# Patient Record
Sex: Male | Born: 1970 | Race: Black or African American | Smoking: Never smoker
Health system: Northeastern US, Academic
[De-identification: ages and names within clinical notes are randomized; demographics above are authoritative.]

---

## 2003-05-05 ENCOUNTER — Encounter: Payer: Self-pay | Admitting: Cardiology

## 2003-05-06 ENCOUNTER — Other Ambulatory Visit: Payer: Self-pay | Admitting: Cardiology

## 2003-05-06 ENCOUNTER — Encounter: Payer: Self-pay | Admitting: Cardiology

## 2015-10-03 ENCOUNTER — Encounter (HOSPITAL_COMMUNITY): Payer: Self-pay | Admitting: *Deleted

## 2015-10-03 ENCOUNTER — Emergency Department (HOSPITAL_COMMUNITY): Payer: Medicaid Other

## 2015-10-03 ENCOUNTER — Emergency Department (HOSPITAL_COMMUNITY)
Admission: EM | Admit: 2015-10-03 | Discharge: 2015-10-04 | Disposition: A | Discharge status: Home / Self Care | Payer: Medicaid Other | Attending: Emergency Medicine | Admitting: Emergency Medicine

## 2015-10-03 DIAGNOSIS — R1011 Right upper quadrant pain: Secondary | ICD-10-CM | POA: Diagnosis present

## 2015-10-03 DIAGNOSIS — M546 Pain in thoracic spine: Secondary | ICD-10-CM | POA: Insufficient documentation

## 2015-10-03 DIAGNOSIS — Z720 Tobacco use: Secondary | ICD-10-CM | POA: Diagnosis not present

## 2015-10-03 DIAGNOSIS — R197 Diarrhea, unspecified: Secondary | ICD-10-CM | POA: Insufficient documentation

## 2015-10-03 LAB — LIPASE, BLOOD: Lipase: 33 U/L (ref 22–51)

## 2015-10-03 LAB — URINALYSIS, ROUTINE W REFLEX MICROSCOPIC
Bilirubin Urine: NEGATIVE
GLUCOSE, UA: NEGATIVE mg/dL
Hgb urine dipstick: NEGATIVE
Ketones, ur: 15 mg/dL — AB
LEUKOCYTES UA: NEGATIVE
Nitrite: NEGATIVE
PH: 5.5 (ref 5.0–8.0)
PROTEIN: NEGATIVE mg/dL
Specific Gravity, Urine: 1.031 — ABNORMAL HIGH (ref 1.005–1.030)
Urobilinogen, UA: 0.2 mg/dL (ref 0.0–1.0)

## 2015-10-03 LAB — CBC
HCT: 48.1 % (ref 39.0–52.0)
Hemoglobin: 15.2 g/dL (ref 13.0–17.0)
MCH: 24.5 pg — AB (ref 26.0–34.0)
MCHC: 31.6 g/dL (ref 30.0–36.0)
MCV: 77.6 fL — AB (ref 78.0–100.0)
PLATELETS: 224 10*3/uL (ref 150–400)
RBC: 6.2 MIL/uL — ABNORMAL HIGH (ref 4.22–5.81)
RDW: 13.2 % (ref 11.5–15.5)
WBC: 8.5 10*3/uL (ref 4.0–10.5)

## 2015-10-03 LAB — BASIC METABOLIC PANEL
Anion gap: 7 (ref 5–15)
BUN: 10 mg/dL (ref 6–20)
CHLORIDE: 103 mmol/L (ref 101–111)
CO2: 31 mmol/L (ref 22–32)
CREATININE: 1.23 mg/dL (ref 0.61–1.24)
Calcium: 9.6 mg/dL (ref 8.9–10.3)
GFR calc Af Amer: >60 mL/min (ref >60)
GFR calc non Af Amer: >60 mL/min (ref >60)
GLUCOSE: 116 mg/dL — AB (ref 65–99)
Potassium: 4.8 mmol/L (ref 3.5–5.1)
Sodium: 141 mmol/L (ref 135–145)

## 2015-10-03 LAB — I-STAT TROPONIN, ED: Troponin i, poc: 0 ng/mL (ref 0.00–0.08)

## 2015-10-03 MED ORDER — SODIUM CHLORIDE 0.9 % IV BOLUS (SEPSIS)
1000.0000 mL | Freq: Once | INTRAVENOUS | Status: AC
  Administered 2015-10-03: 1000 mL via INTRAVENOUS

## 2015-10-03 MED ORDER — IOHEXOL 300 MG/ML  SOLN
100.0000 mL | Freq: Once | INTRAMUSCULAR | Status: AC | PRN
Start: 2015-10-03 — End: 2015-10-03
  Administered 2015-10-03: 100 mL via INTRAVENOUS

## 2015-10-03 MED ORDER — MORPHINE SULFATE (PF) 4 MG/ML IV SOLN
6.0000 mg | Freq: Once | INTRAVENOUS | Status: AC
  Administered 2015-10-03: 6 mg via INTRAVENOUS
  Filled 2015-10-03: qty 2

## 2015-10-03 NOTE — ED Notes (Signed)
Pt in stating that yesterday he had an episode of abd pain with diarrhea, today started with right upper back and shoulder pain, pain is worse with movement and radiates down his side, appears uncomfortable

## 2015-10-03 NOTE — ED Provider Notes (Signed)
CSN: [id number]     Arrival date & time 10/03/15  1739 History   First MD Initiated Contact with Patient 10/03/15 2016     Chief Complaint  Patient presents with  . Back Pain    Patient is a 44 y.o. male presenting with general illness. The history is provided by the patient, the spouse and medical records.  Illness Location:  R flank area and upper abd Quality:  Severe sudden pain Severity:  Severe Onset quality:  Sudden Duration:  1 day Timing:  Constant Progression:  Worsening Chronicity:  New Context:  Pt ate suspicious food yesterday, had left sided abd cramping yesterday and then episodes of nonbloody diarrhea today, followed by acute right flank/abd pain as above Worsened by:  Light touch, position changes  Associated symptoms: abdominal pain and diarrhea   Associated symptoms: no chest pain, no fever, no nausea, no rash, no rhinorrhea, no shortness of breath and no vomiting     History reviewed. No pertinent past medical history. History reviewed. No pertinent past surgical history. History reviewed. No pertinent family history. Social History  Substance Use Topics  . Smoking status: Current Every Day Smoker  . Smokeless tobacco: None  . Alcohol Use: None    Review of Systems  Constitutional: Negative for fever.  HENT: Negative for rhinorrhea.   Eyes: Negative for visual disturbance.  Respiratory: Negative for shortness of breath.   Cardiovascular: Negative for chest pain.  Gastrointestinal: Positive for abdominal pain and diarrhea. Negative for nausea, vomiting and blood in stool.  Genitourinary: Positive for flank pain. Negative for dysuria, hematuria and decreased urine volume.  Musculoskeletal: Positive for back pain.  Skin: Negative for rash.  Allergic/Immunologic: Negative for immunocompromised state.  Neurological: Negative for syncope.  Psychiatric/Behavioral: Negative for confusion.      Allergies  Review of patient's allergies indicates no known  allergies.  Home Medications   Prior to Admission medications   Medication Sig Start Date End Date Taking? Authorizing Provider  bismuth subsalicylate (PEPTO BISMOL) 262 MG/15ML suspension Take 30 mLs by mouth every 6 (six) hours as needed for indigestion or diarrhea or loose stools.   Yes Historical Provider, MD  Multiple Vitamin (MULTIVITAMIN WITH MINERALS) TABS tablet Take 1 tablet by mouth daily.   Yes Historical Provider, MD  naproxen sodium (ANAPROX) 220 MG tablet Take 220 mg by mouth 2 (two) times daily as needed (pain).   Yes Historical Provider, MD   BP 128/68 mmHg  Pulse 65  Temp(Src) 98.5 F (36.9 C) (Oral)  Resp 18  Wt 180 lb (81.647 kg)  SpO2 99% Physical Exam  Constitutional: He is oriented to person, place, and time. He appears well-developed and well-nourished. No distress.  Appears in pain, lying on left side   HENT:  Head: Normocephalic and atraumatic.  Eyes: Right eye exhibits no discharge. Left eye exhibits no discharge.  Neck: No tracheal deviation present.  Cardiovascular: Normal rate and regular rhythm.   Pulmonary/Chest: Effort normal and breath sounds normal. No respiratory distress.  Abdominal: Soft. He exhibits no distension. There is tenderness (entire R side abd with R flank tender to very light touch, difficutl to localize, no r/g, no groin masses ).  Musculoskeletal: He exhibits no edema.  Neurological: He is alert and oriented to person, place, and time.  Skin: Skin is warm and dry.  Psychiatric: He has a normal mood and affect. His behavior is normal.    ED Course  Procedures (including critical care time) Labs Review Labs Reviewed  BASIC METABOLIC PANEL - Abnormal; Notable for the following:    Glucose, Bld 116 (*)    All other components within normal limits  CBC - Abnormal; Notable for the following:    RBC 6.20 (*)    MCV 77.6 (*)    MCH 24.5 (*)    All other components within normal limits  URINALYSIS, ROUTINE W REFLEX MICROSCOPIC  (NOT AT [facility]) - Abnormal; Notable for the following:    Color, Urine AMBER (*)    APPearance HAZY (*)    Specific Gravity, Urine 1.031 (*)    Ketones, ur 15 (*)    All other components within normal limits  LIPASE, BLOOD  I-STAT TROPOININ, ED    Imaging Review Dg Chest 2 View  10/03/2015   CLINICAL DATA:  Epigastric pain and back pain and chest pain since yesterday.  EXAM: CHEST  2 VIEW  COMPARISON:  None.  FINDINGS: The heart size and mediastinal contours are within normal limits. Both lungs are clear. The visualized skeletal structures are unremarkable.  IMPRESSION: Normal chest.   Electronically Signed   By: Francene Boyers M.D.   On: 10/03/2015 18:47   Dg Shoulder Right  10/03/2015   CLINICAL DATA:  Acute right shoulder pain without known injury.  EXAM: RIGHT SHOULDER - 2+ VIEW  COMPARISON:  None.  FINDINGS: There is no evidence of fracture or dislocation. There is no evidence of arthropathy or other focal bone abnormality. Soft tissues are unremarkable.  IMPRESSION: Normal right shoulder.   Electronically Signed   By: Lupita Raider, M.D.   On: 10/03/2015 18:48   Ct Abdomen Pelvis W Contrast  10/03/2015   CLINICAL DATA:  Acute onset of right-sided abdominal pain and right flank pain. Diarrhea. Initial encounter.  EXAM: CT ABDOMEN AND PELVIS WITH CONTRAST  TECHNIQUE: Multidetector CT imaging of the abdomen and pelvis was performed using the standard protocol following bolus administration of intravenous contrast.  CONTRAST:  OMNIPAQUE IOHEXOL 300 MG/ML  SOLN  COMPARISON:  None.  FINDINGS: The visualized lung bases are clear.  The liver and spleen are unremarkable in appearance. The gallbladder is within normal limits. The pancreas and adrenal glands are unremarkable.  The kidneys are unremarkable in appearance. There is no evidence of hydronephrosis. No renal or ureteral stones are seen. No perinephric stranding is appreciated.  No free fluid is identified. The small bowel is unremarkable  in appearance. The stomach is within normal limits. No acute vascular abnormalities are seen.  The appendix is normal in caliber, without evidence of appendicitis. The colon is unremarkable in appearance.  The bladder is mildly distended and grossly unremarkable. The prostate remains normal in size. No inguinal lymphadenopathy is seen.  No acute osseous abnormalities are identified.  IMPRESSION: Unremarkable contrast-enhanced CT of the abdomen and pelvis.   Electronically Signed   By: Roanna Raider M.D.   On: 10/03/2015 23:43   I have personally reviewed and evaluated these images and lab results as part of my medical decision-making.   EKG Interpretation   Date/Time:  Tuesday October 03 2015 18:08:17 EDT Ventricular Rate:  67 PR Interval:  128 QRS Duration: 88 QT Interval:  362 QTC Calculation: 382 R Axis:   76 Text Interpretation:  Sinus rhythm with sinus arrhythmia with occasional  Premature ventricular complexes Moderate voltage criteria for LVH, may be  normal variant Cannot rule out Septal infarct , age undetermined Abnormal  ECG Confirmed by KNOTT MD, DANIEL ([id number]) on 10/03/2015 9:08:14 PM  MDM   Final diagnoses:  Acute right-sided thoracic back pain    44 yo M w no significant PMSHx presenting with abdominal/flank/back pain as above, in setting of suspicious food intake yesterday and abd cramping with diarrhea. AF. VSS. Exam difficult to localize pain, diffuse entire R flank and abdomen. Improved with single does morphine and IVF. Labs/UA remarkable, no leukocytosis. Given severe pain without clear explanation, obtained CT- neg for acute pathology. Discussed results w pt. He states he thinks pain related to GI upset and that since being here his wife, who ate same suspicious food, has developed similar sx. Discussed symptomatic management, return precautions. Dc in stable condition.   Case discussed with Dr. Clydene Pugh who oversaw management of this patient.    Urban Gibson,  MD 10/04/15 1542  Lyndal Pulley, MD 10/05/15 1700

## 2015-10-04 NOTE — ED Notes (Signed)
Pt stable, ambulatory, states understanding of discharge instructions 

## 2017-05-31 IMAGING — CR DG SHOULDER 2+V*R*
3 series · 3 of 3 positions shown · non-contrast
Comparison: None.

CLINICAL DATA: Acute right shoulder pain without known injury.

EXAM:
RIGHT SHOULDER - 2+ VIEW

[shoulder grashey]
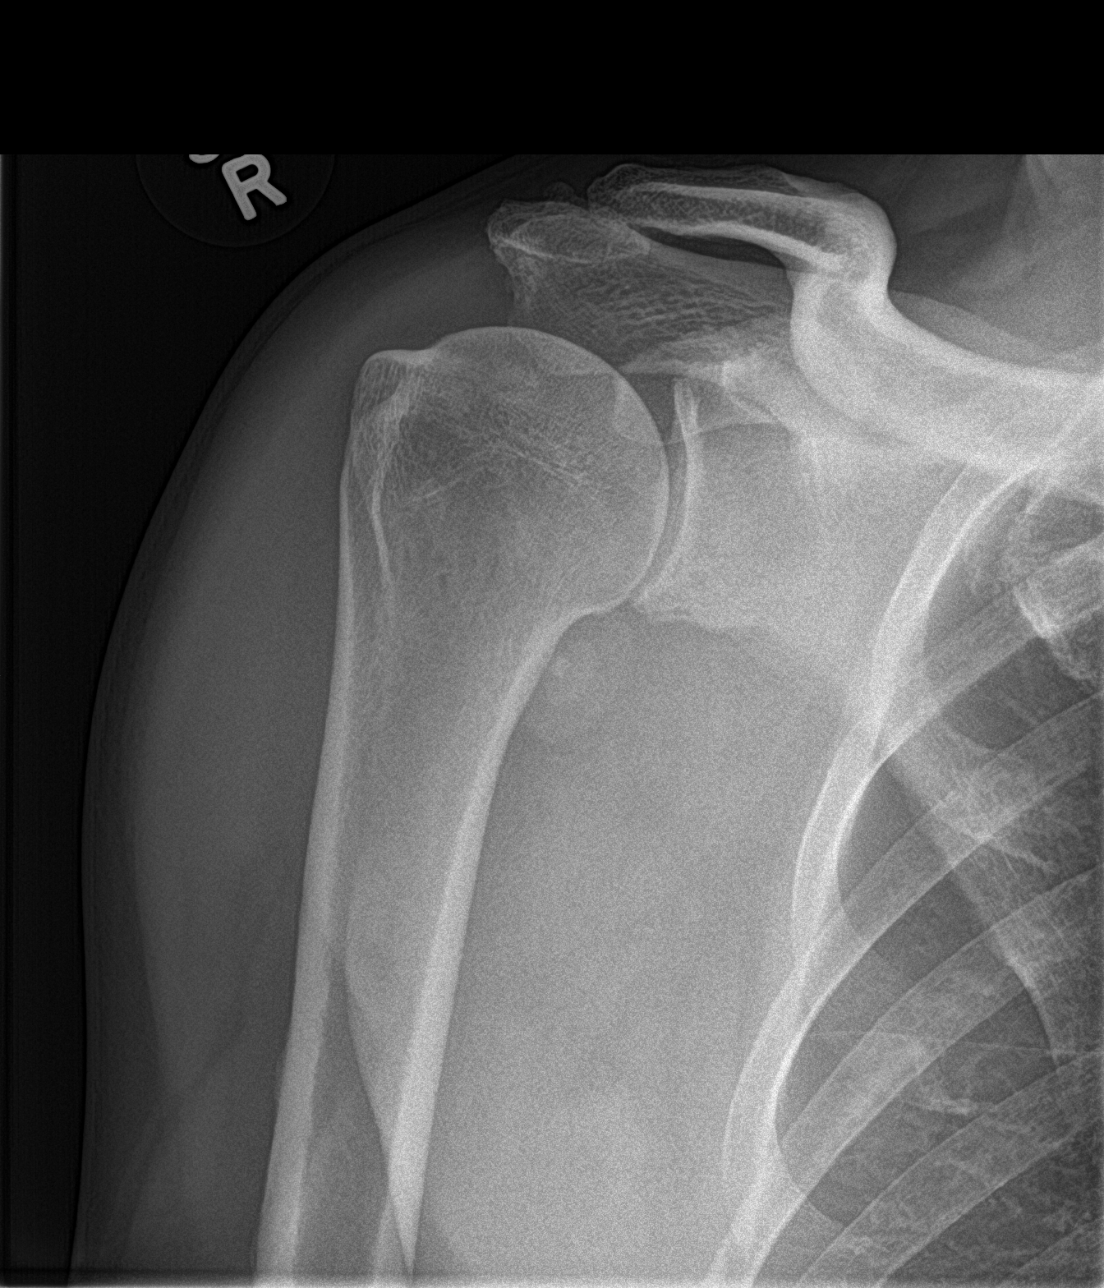

[shoulder y view]
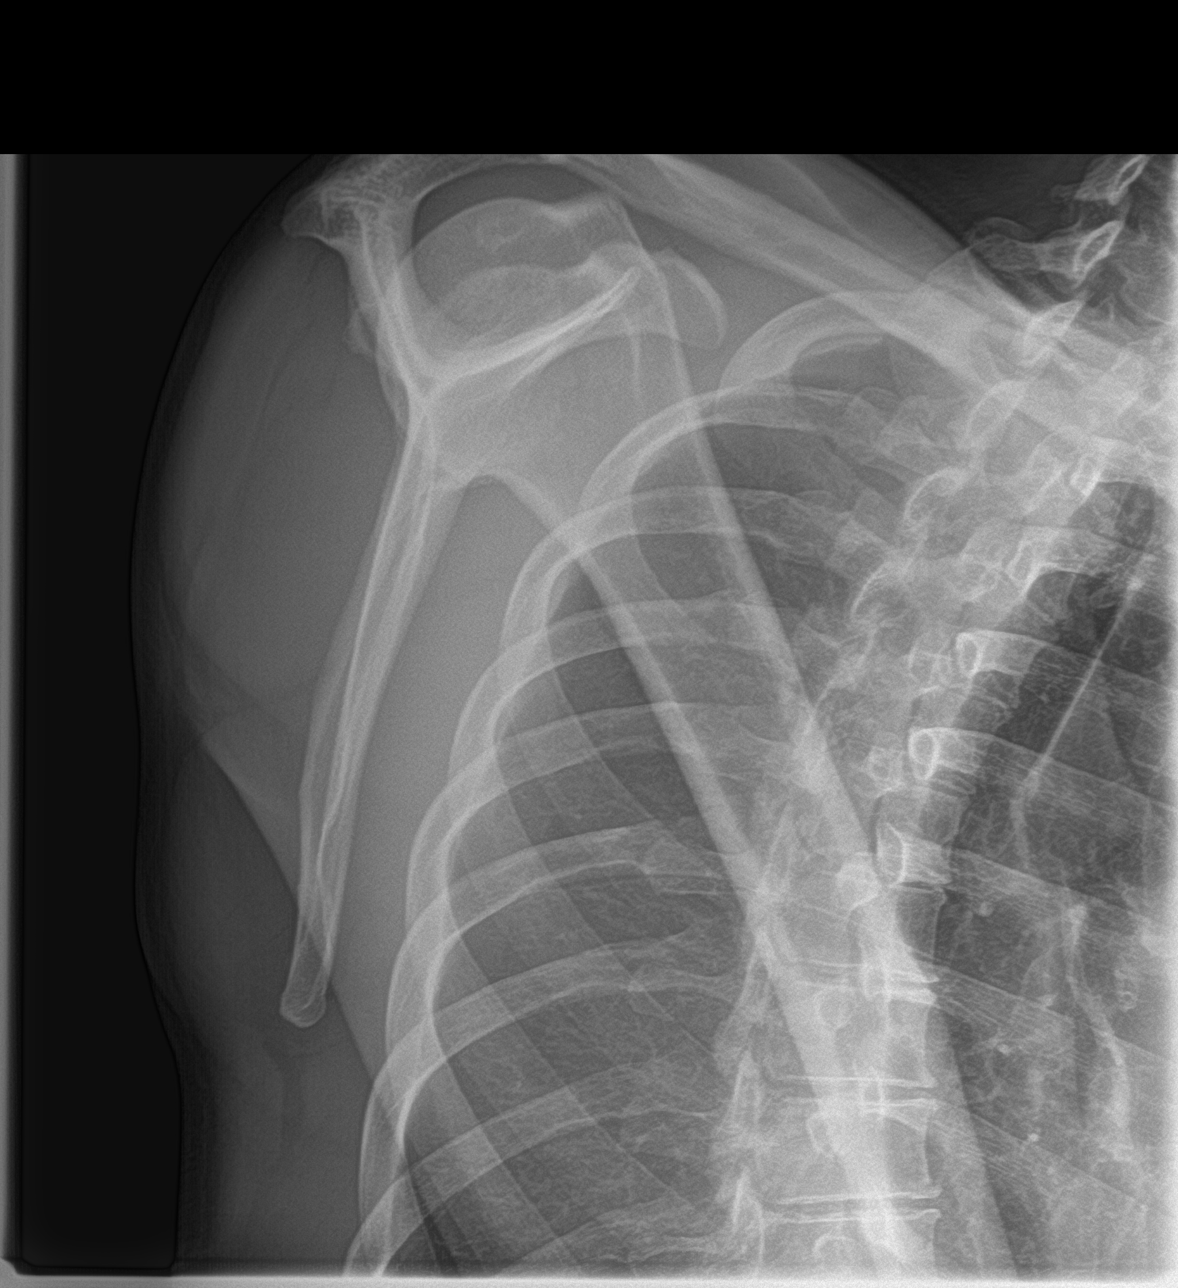

[shoulder axillary]
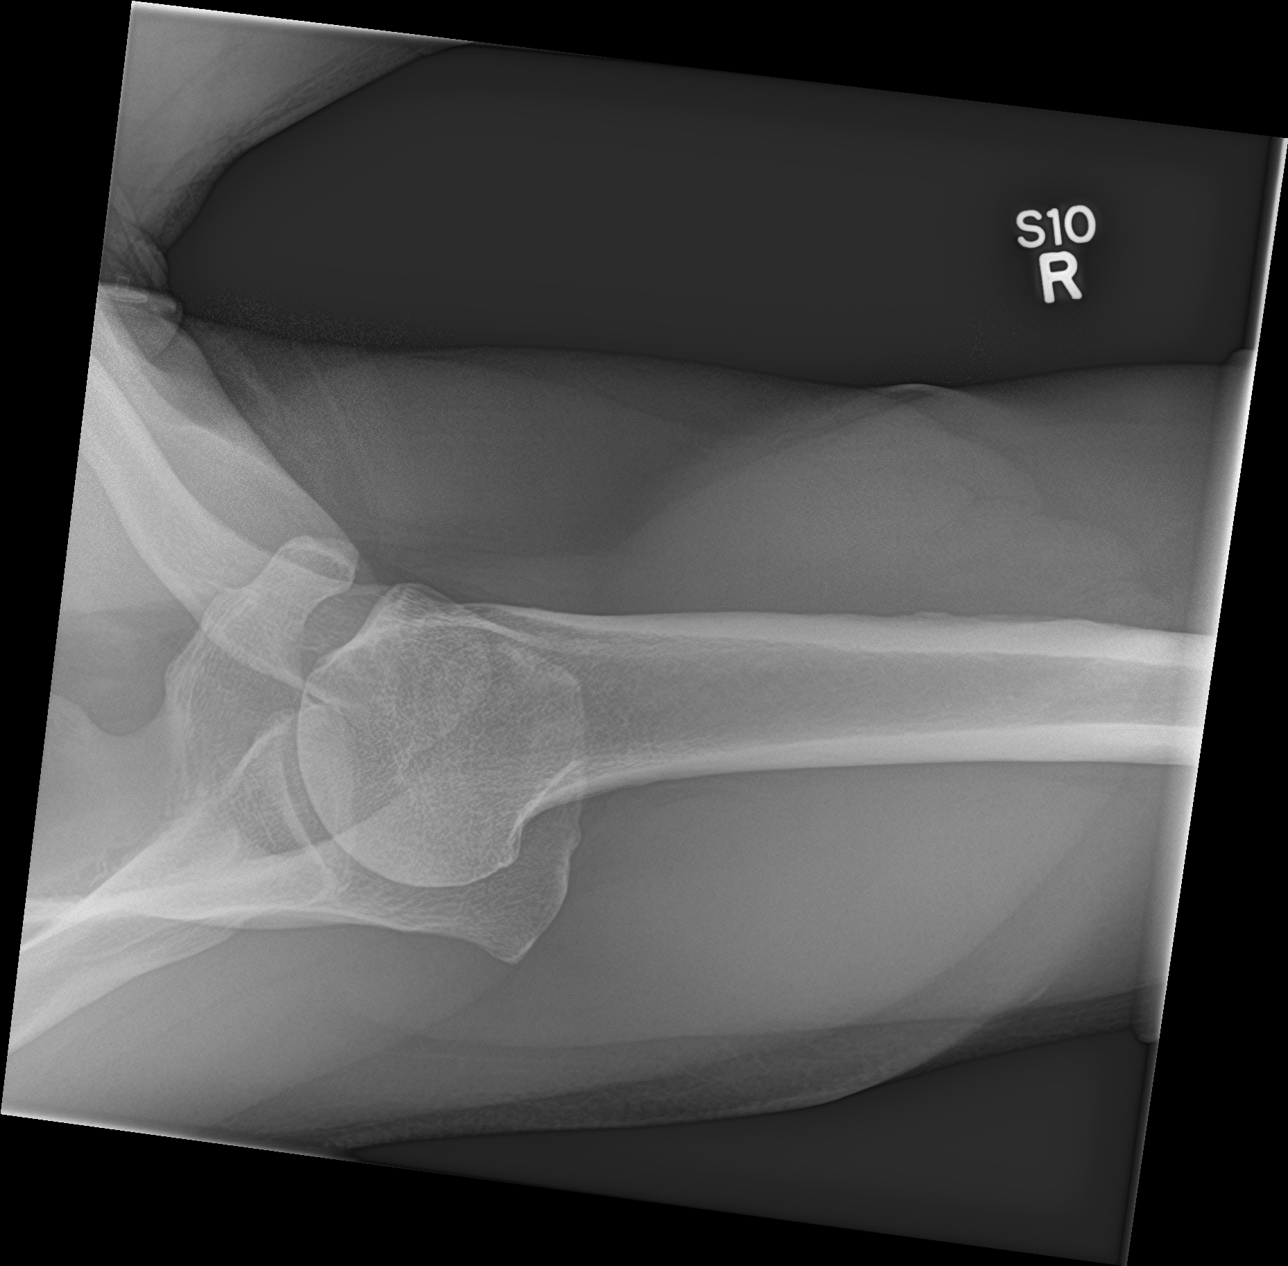

[3 of 3 positions shown; findings below may reference images not displayed]

FINDINGS: There is no evidence of fracture or dislocation. There is no
evidence of arthropathy or other focal bone abnormality. Soft
tissues are unremarkable.
IMPRESSION: Normal right shoulder.

## 2020-02-15 ENCOUNTER — Emergency Department
Admission: EM | Admit: 2020-02-15 | Discharge: 2020-02-15 | Disposition: A | Discharge status: Home / Self Care | Payer: Medicaid Other | Source: Ambulatory Visit | Attending: Emergency Medicine | Admitting: Emergency Medicine

## 2020-02-15 ENCOUNTER — Encounter: Payer: Self-pay | Admitting: Emergency Medicine

## 2020-02-15 DIAGNOSIS — M25561 Pain in right knee: Secondary | ICD-10-CM | POA: Insufficient documentation

## 2020-02-15 LAB — HM HIV SCREENING OFFERED

## 2020-02-15 MED ORDER — KETOROLAC TROMETHAMINE 30 MG/ML IJ SOLN *I*
15.0000 mg | Freq: Once | INTRAMUSCULAR | Status: AC
Start: 2020-02-15 — End: 2020-02-15
  Administered 2020-02-15: 15 mg via INTRAMUSCULAR
  Filled 2020-02-15: qty 1

## 2020-02-15 NOTE — Discharge Instructions (Signed)
1. Continue icing area- Please use ice on your knee - apply for 20 minutes every 1-2 hours while awake for next 2 days.   2.Rest  3.Tylenol/ibuprofen  4.Splint  5.Elevate    You have been referred to a primary care physician and orthopedic physician. You will receive a phone call within 3-4 business days to establish care, you may schedule an appointment as needed at that time.    Review the attached information thoroughly. Do not hesitate to return to the emergency department if your condition worsens.
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
## 2020-02-17 NOTE — Progress Notes (Signed)
Received an inquiry that this patient was interested in becoming a new patient at [facility].  Attempted to contact the patient, but was unable to leave a message as their voicemail box was full.

## 2020-04-21 ENCOUNTER — Ambulatory Visit: Payer: Medicaid Other | Admitting: Sports Medicine

## 2020-08-02 ENCOUNTER — Other Ambulatory Visit
Admission: RE | Admit: 2020-08-02 | Discharge: 2020-08-02 | Disposition: A | Discharge status: Home / Self Care | Payer: Medicaid Other | Source: Ambulatory Visit | Attending: Physician Assistant | Admitting: Physician Assistant

## 2020-08-02 DIAGNOSIS — Z0000 Encounter for general adult medical examination without abnormal findings: Secondary | ICD-10-CM | POA: Insufficient documentation

## 2020-08-02 DIAGNOSIS — Z113 Encounter for screening for infections with a predominantly sexual mode of transmission: Secondary | ICD-10-CM | POA: Insufficient documentation

## 2020-08-02 LAB — PSA, TOTAL AND FREE
PSA (eff. 4-2010): 0.6 ng/mL (ref 0.00–4.00)
PSA,FREE (eff. 4-2010): 0.25 ng/mL
PSA,Free %: 42 %

## 2020-08-02 LAB — DATE/TIME NOT PROVIDED

## 2020-08-03 LAB — N. GONORRHOEAE DNA AMPLIFICATION: N. gonorrhoeae DNA Amplification: 0

## 2020-08-03 LAB — TRICHOMONAS DNA AMPLIFICATION: Trichomonas DNA amplification: 0

## 2020-08-03 LAB — CHLAMYDIA PLASMID DNA AMPLIFICATION: Chlamydia Plasmid DNA Amplification: 0

## 2020-10-03 ENCOUNTER — Encounter: Payer: Self-pay | Admitting: Gastroenterology

## 2020-10-05 ENCOUNTER — Other Ambulatory Visit: Payer: Self-pay | Admitting: Surgery

## 2020-10-05 DIAGNOSIS — Z01812 Encounter for preprocedural laboratory examination: Secondary | ICD-10-CM

## 2020-10-05 DIAGNOSIS — Z20822 Contact with and (suspected) exposure to covid-19: Secondary | ICD-10-CM

## 2020-10-05 DIAGNOSIS — Z1211 Encounter for screening for malignant neoplasm of colon: Secondary | ICD-10-CM

## 2020-12-06 ENCOUNTER — Telehealth: Payer: Self-pay

## 2020-12-18 ENCOUNTER — Other Ambulatory Visit: Payer: Medicaid Other | Admitting: Surgery

## 2021-10-08 ENCOUNTER — Emergency Department: Payer: Medicaid Other | Admitting: Radiology

## 2021-10-08 ENCOUNTER — Emergency Department
Admission: EM | Admit: 2021-10-08 | Discharge: 2021-10-08 | Disposition: A | Discharge status: Home / Self Care | Payer: Medicaid Other | Source: Ambulatory Visit | Attending: Emergency Medicine | Admitting: Emergency Medicine

## 2021-10-08 ENCOUNTER — Encounter: Payer: Self-pay | Admitting: Student in an Organized Health Care Education/Training Program

## 2021-10-08 ENCOUNTER — Other Ambulatory Visit: Payer: Self-pay

## 2021-10-08 DIAGNOSIS — R200 Anesthesia of skin: Secondary | ICD-10-CM

## 2021-10-08 DIAGNOSIS — I10 Essential (primary) hypertension: Secondary | ICD-10-CM | POA: Insufficient documentation

## 2021-10-08 DIAGNOSIS — I809 Phlebitis and thrombophlebitis of unspecified site: Secondary | ICD-10-CM

## 2021-10-08 DIAGNOSIS — M791 Myalgia, unspecified site: Secondary | ICD-10-CM

## 2021-10-08 DIAGNOSIS — I8002 Phlebitis and thrombophlebitis of superficial vessels of left lower extremity: Secondary | ICD-10-CM | POA: Insufficient documentation

## 2021-10-08 DIAGNOSIS — I83813 Varicose veins of bilateral lower extremities with pain: Secondary | ICD-10-CM | POA: Insufficient documentation

## 2021-10-08 MED ORDER — IBUPROFEN 600 MG PO TABS *I*
600.0000 mg | ORAL_TABLET | Freq: Once | ORAL | Status: AC
Start: 2021-10-08 — End: 2021-10-08
  Administered 2021-10-08: 600 mg via ORAL
  Filled 2021-10-08: qty 1

## 2021-10-08 NOTE — ED Triage Notes (Addendum)
Pt reports vericose veins to LLE that have become more painful overnight. Pt states veins in his LLE "felt hard".            Prehospital medications given: No

## 2021-10-08 NOTE — ED Provider Notes (Addendum)
History     Chief Complaint   Patient presents with    Leg Pain     Ronnie Morris is a 50 y.o. male with a past medical history of hypertension varicose veins who presents to the ED with a chief complaint of left lower extremity pain onset 0300 that woke him up from sleep this morning. Patient reports he feels like his veins are "hard", and have been very painful and warm to touch.  Also reports left hand paresthesias just from the wrist down to his hand. Denies fevers, chills, chest pain, shortness of breath, abdominal pain, nausea, vomiting, rashes, leg swelling, or other symptoms. Denies history of PE/DVT, unilateral leg swelling, recent travel/trauma, recent immobilization, recent malignancy treatment, hemoptysis, or hormone use.      History provided by:  Patient  Language interpreter used: No        Medical/Surgical/Family History     History reviewed. No pertinent past medical history.     There is no problem list on file for this patient.           History reviewed. No pertinent surgical history.  No family history on file.       Social History     Tobacco Use    Smoking status: Not on file    Smokeless tobacco: Not on file   Substance Use Topics    Alcohol use: Not on file    Drug use: Not on file     Living Situation     Questions Responses    Patient lives with     Homeless     Caregiver for other family member     External Services     Employment     Domestic Violence Risk                 Review of Systems   Review of Systems   Constitutional: Negative for chills, diaphoresis and fever.   HENT: Negative for congestion.    Eyes: Negative for visual disturbance.   Respiratory: Negative for shortness of breath.    Cardiovascular: Negative for chest pain.   Gastrointestinal: Negative for abdominal pain, nausea and vomiting.   Musculoskeletal: Positive for myalgias. Negative for gait problem.   Skin: Negative for color change, rash and wound.   Allergic/Immunologic: Negative for  immunocompromised state.   Neurological: Positive for numbness. Negative for dizziness and headaches.   Hematological: Does not bruise/bleed easily.       Physical Exam     Triage Vitals  Triage Start: Start, (10/08/21 0744)   First Recorded BP: (!) 154/101, Resp: 16, Temp: 36.8 C (98.2 F), Temp src: TEMPORAL Oxygen Therapy SpO2: 98 %, Oximetry Source: Rt Hand, O2 Device: None (Room air), Heart Rate: 98, (10/08/21 0746)  .  First Pain Reported  0-10 Scale: 0, (10/08/21 0932)       Physical Exam  Vitals and nursing note reviewed.   Constitutional:       Appearance: Normal appearance.      Comments: Well appearing, not in distress. Mildly hypertensive but otherwise vital signs are appropriate.   HENT:      Head: Normocephalic and atraumatic.      Nose: Nose normal.      Mouth/Throat:      Mouth: Mucous membranes are moist.      Pharynx: Oropharynx is clear.   Eyes:      Extraocular Movements: Extraocular movements intact.      Pupils: Pupils are  equal, round, and reactive to light.   Cardiovascular:      Rate and Rhythm: Normal rate and regular rhythm.      Pulses: Normal pulses.      Heart sounds: Normal heart sounds. No murmur heard.  Pulmonary:      Effort: Pulmonary effort is normal.      Breath sounds: Normal breath sounds.   Abdominal:      Palpations: Abdomen is soft.      Tenderness: There is no abdominal tenderness.   Musculoskeletal:         General: Normal range of motion.      Right lower leg: No edema.      Left lower leg: No edema.        Legs:       Comments: FROM of the hips, knees and ankles without pain  Feet are warm and well perfused  2+ DP pulses bilaterally  SILT throughout   Skin:     General: Skin is warm and dry.      Capillary Refill: Capillary refill takes less than 2 seconds.      Comments: 5 cm area of localized swelling to medial aspect to area of proximal tibia (patient has this at baseline), with overlying varicose veins with palpable cords within, tender to palpation, no overlying  erythema, warmth, drainage, or wounds. Varicose veins present in bilateral lower extremities.   Neurological:      General: No focal deficit present.      Mental Status: He is alert and oriented to person, place, and time.      Sensory: No sensory deficit (Sensation instruction bilateral upper extremities).      Motor: No weakness (5/5 strength in bilateral upper extremities).   Psychiatric:         Mood and Affect: Mood normal.         Behavior: Behavior normal.         Medical Decision Making   Patient seen by me on:  10/08/2021    Assessment:  [name] is a 50 y.o. male who presents with concerns of painful/hard area of swelling to left lower extremity.  Presentation is consistent with thrombophlebitis.  No overlying skin changes to suggest cellulitis.  Area does not have a collection of fluctuance or induration to suggest abscess.  May have underlying DVT.  Plan to obtain left lower extremity ultrasound.    Differential diagnosis:  Thrombophlebitis, DVT, unlikely cellulitis, abscess    Plan:  Orders Placed This Encounter      US doppler vein LEFT lower extremity    ibuprofen (ADVIL,MOTRIN) tablet 600 mg     Date Action Dose Route User    Discharged on 10/08/2021    10/08/2021 0933 Given 600 mg Oral Harlin Heys, RN       Independent review of: Existing labs, chart/prior records    ED Course and Disposition:  Discussed with patient his results of the ultrasound.Advised compression stockings, NSAIDs, rest, warm compresses, and elevation. Advise follow-up with his PCP in 7 days for reevaluation. Discussed return precautions for worsening or new concerning symptoms.  Patient verbalizes understanding and is in agreement with plan. All questions answered at this time.            ED Course as of 10/08/21 1137   Mon Oct 08, 2021   7371 No DVT in the left lower extremity. Superficial thrombophlebitis involving the great saphenous vein at the upper calf.  Marella Chimes, MD      Resident  Attestation:    Patient seen by me on 10/08/2021.    I saw and evaluated the patient. I have reviewed and edited the resident's/fellow's note and confirm the findings and plan of care as documented above.    Author:  Marita Snellen, MD       Marella Chimes, MD  Resident  10/08/21 1141       Marita Snellen, MD  10/09/21 [phone]

## 2021-10-08 NOTE — Discharge Instructions (Signed)
Ronnie Morris was seen at [facility] Emergency Room for leg pain on October 08, 2021.    Ultrasound(s) were completed which showed a superficial clot and inflammation in your leg.     For discomfort you can apply warm compresses to your leg, keep it elevated, and take tylenol and motrin as needed pain. You can also buy compression stockings to wear in the day. Pressure stockings improve blood flow and help prevent clots in your legs. Do not wear them when you sleep.    Beulah Gandy should follow-up with Caryl Asp Fearnow's primary care physician in 7 days.     Please return to our emergency department or seek evaluation for fever greater than 103F despite Tylenol or Motrin use, chest pain, difficulty breathing , unable to eat or drink, vomiting, weakness, numbness, and severe pain, or any new or concerning symptoms.     Thank you for choosing [facility] for your care.

## 2021-10-08 NOTE — ED Notes (Signed)
Patient verbalizes understanding of discharge instructions and prescriptions. All questions/concerns addressed. appropriate clothing in place, VSS, AOx4, tolerating PO. Patient ambulated out on own, stable while ambulatory. Has an appropriate ride home. Given access to MyChart so patient can review admission results and future appointments.

## 2022-01-15 ENCOUNTER — Other Ambulatory Visit
Admission: RE | Admit: 2022-01-15 | Discharge: 2022-01-15 | Disposition: A | Discharge status: Home / Self Care | Payer: Medicaid Other | Source: Ambulatory Visit | Attending: Physician Assistant | Admitting: Physician Assistant

## 2022-01-15 DIAGNOSIS — Z125 Encounter for screening for malignant neoplasm of prostate: Secondary | ICD-10-CM | POA: Insufficient documentation

## 2022-01-15 LAB — PSA (EFF.4-2010): PSA (eff. 4-2010): 0.47 ng/mL (ref 0.00–4.00)

## 2022-02-13 ENCOUNTER — Encounter: Payer: Self-pay | Admitting: Gastroenterology

## 2022-02-20 ENCOUNTER — Emergency Department
Admission: EM | Admit: 2022-02-20 | Discharge: 2022-02-20 | Disposition: A | Discharge status: Home / Self Care | Payer: Medicaid Other | Source: Ambulatory Visit | Attending: Emergency Medicine | Admitting: Emergency Medicine

## 2022-02-20 ENCOUNTER — Emergency Department: Payer: Medicaid Other

## 2022-02-20 ENCOUNTER — Encounter: Payer: Self-pay | Admitting: Emergency Medicine

## 2022-02-20 DIAGNOSIS — M76891 Other specified enthesopathies of right lower limb, excluding foot: Secondary | ICD-10-CM

## 2022-02-20 DIAGNOSIS — M25561 Pain in right knee: Secondary | ICD-10-CM | POA: Insufficient documentation

## 2022-02-20 DIAGNOSIS — M7641 Tibial collateral bursitis [Pellegrini-Stieda], right leg: Secondary | ICD-10-CM

## 2022-02-20 DIAGNOSIS — M25461 Effusion, right knee: Secondary | ICD-10-CM

## 2022-02-20 NOTE — ED Provider Notes (Signed)
History     Chief Complaint   Patient presents with    Knee Pain     Columbus Ice is a 51 y.o. male who presents with right knee pain.     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 States about a year ago he had a meniscus repair of his right knee. States he has been doing well up until the past few days when he has had worsening right medial knee pain. Denies trauma, swelling, redness, or warmth. States he lifts a lot at work and has pain doing so.             Medical/Surgical/Family History     History reviewed. No pertinent past medical history.     There is no problem list on file for this patient.           History reviewed. No pertinent surgical history.  No family history on file.          Living Situation     Questions Responses    Patient lives with     Homeless     Caregiver for other family member     External Services     Employment     Domestic Violence Risk                 Review of Systems   Review of Systems    Physical Exam     Triage Vitals  Triage Start: Start, (02/20/22 0708)   First Recorded BP: (!) 193/108, Resp: 16, Temp: 36.4 C (97.5 F), Temp src: TEMPORAL Oxygen Therapy SpO2: 99 %, Oximetry Source: Lt Hand, Heart Rate: 63, (02/20/22 0712)  .  First Pain Reported  0-10 Scale: 5, Pain Location/Orientation: Knee Right, (02/20/22 7106)       Physical Exam  Vitals and nursing note reviewed.   Constitutional:       General: He is not in acute distress.     Appearance: Normal appearance.   Musculoskeletal:      Comments: Pain to palpation of right medial joint line inferiorly, no edema/joint effusion noted on exam, full ROM, no warmth or redness   Skin:     General: Skin is warm.   Neurological:      General: No focal deficit present.      Mental Status: He is alert.   Psychiatric:         Mood and Affect: Mood normal.         Medical Decision Making   Patient seen by me on:  02/20/2022    Assessment:  Kip Cropp is a 51 y.o. male with hx of right meniscus repair, who presents with  right knee pain. Pain to palpation of right medial joint line inferiorly, no edema/joint effusion noted on exam, full ROM, no warmth or redness. No laxity with ligamentous testing.       Differential diagnosis:  Meniscal injury, no joint effusion or ligament to suggest acute ligamentous injury, no warmth/erythema or limited ROM to suggest septic arthritis    Plan:    - xray reassuring  - ACE wrap  - discussed he should call ortho today to schedule appt asap for re evaluation of knee, no heavy lifting at work until cleared by ortho                Irene Limbo, PA             [place], [place], [place]  02/20/22 [phone]

## 2022-02-20 NOTE — ED Triage Notes (Addendum)
pt presents with right knee pain 8/10, pt report he had meniscus repair a year ago on same knee,noticed his right knee was stiff last night when he got home from work, this morning patient reports his knee is more stiff with 8/10 pain, nothing taken for pain today, pt denies new injury, pt does work as a Lawyer.pt hypertensive in triage reports he took BP meds this morning at 0545.           Prehospital medications given: No

## 2022-02-20 NOTE — Discharge Instructions (Signed)
Your xray showed no evidence of fluid or blood in your joint. There is no evidence of infection. This is likely musculoskeletal pain versus re injury to your meniscus. Please follow up with orthopedic team as soon as possible for evaluation.     Wrap in ACE bandage, rest, ice, elevate and use motrin and tylenol for pain and inflammation.

## 2022-04-25 ENCOUNTER — Encounter: Payer: Self-pay | Admitting: Gastroenterology

## 2022-05-01 ENCOUNTER — Other Ambulatory Visit: Payer: Self-pay

## 2022-05-01 ENCOUNTER — Ambulatory Visit: Payer: MEDICAID | Admitting: Physician Assistant

## 2022-05-01 VITALS — BP 108/72 | HR 67

## 2022-05-01 DIAGNOSIS — S62663A Nondisplaced fracture of distal phalanx of left middle finger, initial encounter for closed fracture: Secondary | ICD-10-CM

## 2022-05-01 NOTE — Progress Notes (Signed)
[place] [facility] SURGERY   HAND URGENT CARE      NAME: Ronnie Morris                                      MRN: [id number]  DATE: 05/01/22        REASON FOR VISIT: Left middle finger injury       ASSESSMENT     Ronnie Morris is a 51 y.o.male presenting for a evaluation for a suspicious left middle finger fracture sustained approx 1.5 weeks ago after crushing it in his bedroom door.        PLAN     I had a long discussion with the patient regarding the pathophysiology of this problem and we agreed on the following plan:    Patient will complete his 7 day course of antibiotics. We discussed soaking his finger for 20 minutes 2-3 times a day in warm soapy water. He will continue to keep the area clean and dry.    Xrays reviewed, suspicious for a middle finger distal phalanx fracture, with point tenderness. Middle finger was buddy taped for comfort. He will continue with elevation to help with decreasing swelling. We discussed continuing with tylenol/ibuprofen for pain control as he is having relief.    He will follow up in 2 week for reevaluation.      Follow up: 2 weeks   Work:  Not currently working      The patient is in full understanding with the above and agrees with the plan. All questions were answered.    SUBJECTIVE     History of present Illness:  Ronnie Morris is a 51 y.o. male who presents for an Urgent Care follow up/ initial evaluation  of left middle finger pain/fracture after a crush injury 1.5 weeks ago when he slammed his bedroom door on his finger. Patient has been taking tylenol/ibuprofen for pain control, placing ice on his finger and elevating. He noticed some discharge and went to the UC on 4/28 where the make a small poke hole and drained out some pus. He was placed on a 7 day course of antibiotics (Keflex?) which he has been taking as instructed. Currently, he denies any numbness, burning, tingling, fevers, chills.      No past medical history  on file.    No past surgical history on file.    Allergy History as of 05/01/22      No Known Allergies (drug, envir, food or latex)                  OBJECTIVE     Vitals: There were no vitals taken for this visit.  General: The patient is alert and oriented x3, is well dressed and well kept, pleasant and cooperative through the evaluation.  Constitutional: Stable vital signs, in no acute distress.    Upper Extremities:  Comprehensive exam of the LEFT upper extremity: swelling over the dorsal side of the distal middle finger just proximal to the nailbed. Nail is intact without hematoma or signs of injury. No active bleeding or drainage. No expressible drainage. No fluctuance appreciated. Sensation is intact to light touch. Tenderness to palpation over the ulnar side of the middle DIP joint.   Able to fully flex and extend the fingers to the palmar crease without rotational deformities. Firing AIN/PIN/u/ax nerves. Can oppose thumb to SF. Fingers warm and  perfused.         Imaging/ Studies:  Xrays of the left hand from 4/28 were reviewed, suspicious for a nondisplaced distal phalanx fracture of the middle finger.         Geralynn Rile, PA as of 12:53 PM 05/01/2022

## 2022-05-22 ENCOUNTER — Ambulatory Visit: Payer: Medicaid Other | Admitting: Physician Assistant

## 2022-08-16 ENCOUNTER — Encounter: Payer: Self-pay | Admitting: Student in an Organized Health Care Education/Training Program

## 2022-08-16 ENCOUNTER — Emergency Department: Payer: Medicaid Other

## 2022-08-16 ENCOUNTER — Emergency Department
Admission: EM | Admit: 2022-08-16 | Discharge: 2022-08-16 | Disposition: A | Discharge status: Home / Self Care | Payer: Medicaid Other | Source: Ambulatory Visit | Attending: Emergency Medicine | Admitting: Emergency Medicine

## 2022-08-16 DIAGNOSIS — M25562 Pain in left knee: Secondary | ICD-10-CM

## 2022-08-16 DIAGNOSIS — Y9389 Activity, other specified: Secondary | ICD-10-CM | POA: Insufficient documentation

## 2022-08-16 DIAGNOSIS — M79672 Pain in left foot: Secondary | ICD-10-CM

## 2022-08-16 DIAGNOSIS — M7732 Calcaneal spur, left foot: Secondary | ICD-10-CM

## 2022-08-16 DIAGNOSIS — S8252XA Displaced fracture of medial malleolus of left tibia, initial encounter for closed fracture: Secondary | ICD-10-CM | POA: Insufficient documentation

## 2022-08-16 DIAGNOSIS — Y92488 Other paved roadways as the place of occurrence of the external cause: Secondary | ICD-10-CM | POA: Insufficient documentation

## 2022-08-16 DIAGNOSIS — S82892A Other fracture of left lower leg, initial encounter for closed fracture: Secondary | ICD-10-CM

## 2022-08-16 DIAGNOSIS — Y998 Other external cause status: Secondary | ICD-10-CM | POA: Insufficient documentation

## 2022-08-16 DIAGNOSIS — M25572 Pain in left ankle and joints of left foot: Secondary | ICD-10-CM

## 2022-08-16 DIAGNOSIS — M79662 Pain in left lower leg: Secondary | ICD-10-CM

## 2022-08-16 DIAGNOSIS — M1712 Unilateral primary osteoarthritis, left knee: Secondary | ICD-10-CM

## 2022-08-16 DIAGNOSIS — Z23 Encounter for immunization: Secondary | ICD-10-CM | POA: Insufficient documentation

## 2022-08-16 MED ORDER — ACETAMINOPHEN 325 MG PO TABS *I*
650.0000 mg | ORAL_TABLET | Freq: Once | ORAL | Status: AC
Start: 2022-08-16 — End: 2022-08-16
  Administered 2022-08-16: 650 mg via ORAL
  Filled 2022-08-16: qty 2

## 2022-08-16 MED ORDER — ONDANSETRON 4 MG PO TBDP *I*
4.0000 mg | ORAL_TABLET | Freq: Once | ORAL | Status: AC
Start: 2022-08-16 — End: 2022-08-16
  Administered 2022-08-16: 4 mg via ORAL
  Filled 2022-08-16: qty 1

## 2022-08-16 MED ORDER — TETANUS-DIPHTH-ACELL PERT 5-2.5-18.5 LF-MCG/0.5 IM SUSP *WRAPPED*
0.5000 mL | Freq: Once | INTRAMUSCULAR | Status: AC
Start: 2022-08-16 — End: 2022-08-16
  Administered 2022-08-16: 0.5 mL via INTRAMUSCULAR
  Filled 2022-08-16: qty 0.5

## 2022-08-16 MED ORDER — LIDOCAINE HCL 1 % IJ SOLN *I*
20.0000 mL | Freq: Once | INTRAMUSCULAR | Status: AC
Start: 2022-08-16 — End: 2022-08-16
  Administered 2022-08-16: 20 mL via SUBCUTANEOUS
  Filled 2022-08-16: qty 20

## 2022-08-16 MED ORDER — KETOROLAC TROMETHAMINE 30 MG/ML IJ SOLN *I*
15.0000 mg | Freq: Once | INTRAMUSCULAR | Status: AC
Start: 2022-08-16 — End: 2022-08-16
  Administered 2022-08-16: 15 mg via INTRAMUSCULAR
  Filled 2022-08-16: qty 1
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
## 2022-08-16 NOTE — ED Provider Notes (Deleted)
Charlaine Utsey, DO  09/04/22 1623

## 2022-08-16 NOTE — Discharge Instructions (Addendum)
You were seen today for an ankle fracture:    Please keep splint clean and dry.  Do not bare weight on ankle  Pain Control with ibuprofen and tylenol. Elevate/ice injured extremity. You may take 2 tablets extra strength tylenol (1000mg  total) and 2 tablets ibuprofen (400mg  total) every 6 hours as needed for pain.Do not take more than 4 doses of either medication in a 24 hour period.  Follow-up with Dr. Andee Poles in 5-7 days, call [phone] to make an appointment  Return to the ED if you have any numbness, color change, uncontrollable pain, or any other new or concerning symptoms.
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
## 2022-08-16 NOTE — Invasive Procedure Plan of Care (Signed)
[facility]  [facility]  [facility]    CONSENT FOR MEDICAL  OR SURGICAL PROCEDURE                            Patient Name: Ronnie Morris  [facility] [address]                                                              DOB: [birth year]          . Please read this form or have someone read it to you.  . It's important to understand all parts of this form. If something isn't clear, ask [place] to explain.  . When you sign it, that means you understand the form and give [place] permission to do this surgery or procedure.    . I agree for Orthopaedic Trauma Surgery Attending along with any assistants* they may choose, to treat the following condition(s): Left medial malleolus ankle fracture  . By doing this surgery or procedure on me: Left ankle open versus closed reduction with internal versus external fixation, all indicated procedures  . This is also known as: Fixing the broken bone  . Laterality: Left    1. The care provider has explained my condition to me. They have told me how the procedure can help me. They have told me about other ways of treating my condition. I understand the care provider cannot guarantee the result of the procedure. If I don't have this procedure, my other choices are: No Surgery; Non-operative management    2. The care provider has told me the risks (problems that can happen) of the procedure. I understand there may be unwanted results. The risks that are related to this procedure include:   - Bleeding  - Infection  - Damage to surrounding structures (muscles, tendons, nerves, blood vessels),   - Failure of the bone to heal,  - Failure of the wound to heal,   - Hardware failure,   - Need for further surgery  - Persistent pain  - Risks of anesthesia (including but not limited to heart attack, stroke and death)  - Deep vein thrombosis  - Pulmonary embolus      3. I understand that during the procedure, my care provider may find a condition that we didn't know about before  the treatment started. Therefore, I agree that my care provider can perform any other treatment which they think is necessary and available.    4. I understand the care provider may remove tissue, body parts, or materials during this procedure. These materials may be used to help with my diagnosis and treatment. They might also be used for teaching purposes or for research studies that I have separately agreed to participate in. Otherwise they will be disposed of as required by law.    5. My care provider might want a representative from a medical device company to be there during my procedure. I understand that person works for:  [employer], Publishing copy, [employer], Publishing rights manager        The ways they might help my care provider during my procedure include:        Helping the operating room staff prepare the special device my care provider  wants to use and Providing information and support to operating room staff regarding the device    6. Here are my decisions about receiving blood, blood products, or tissues. I understand my decisions cover the time before, during and after my procedure, my treatment, and my time in the hospital. After my procedure, if my condition changes a lot, my care provider will talk with me again about receiving blood or blood products. At that time, my care provider might need me to review and sign another consent form, about getting or refusing blood.    I understand that the blood is from the community blood supply. Volunteers donated the blood, the volunteers were screened for health problems. The blood was examined with very sensitive and accurate tests to look for hepatitis, HIV/AIDS, and other diseases. Before I receive blood, it is tested again to make sure it is the correct type.    My chances of getting a sickness from blood products are small. But no transfusion is 100% safe. I understand that my care provider feels the good I will receive from the blood is greater than the  chances of something going wrong. My care provider has answered my questions about blood products.      My decision  about blood or  blood products   Yes, I agree to receive blood or blood products if my care provider thinks they're needed.        My decision   about tissue  Implants     Yes, I agree to receive tissue implants if my care provider thinks they're needed.          I understand this  form.    My care provider  or his/her  assistants have  explained:   What I am having done and why I need it.  What other choices I can make instead of having this done.  The benefits and possible risks (problems) to me of having this done.  The benefits and possible risks (problems) to me of receiving transplants, blood, or blood products.  There is no guarantee of the results.  The care provider may not stay with me the entire time that I am in the operating or procedure room.  My provider has explained how this may affect my procedure. My provider has answered my  questions about this.         I give my  permission for  this surgery or  procedure.          _______________________________________________                                     My signature  (or parent or other person authorized to sign for you, if you are unable to sign for  yourself or if you are under 93 years old)        08/16/2022  ______           Date   5:41 PM       _____        Time   Electronic Signatures will display at the bottom of the consent form.    Care provider's statement: I have discussed the planned procedure, including the possibility for transfusion of blood  products or receipt of tissue as necessary; expected benefits; the possible complications and risks; and possible alternatives  and their benefits and risks with the patients or  the patient's surrogate. In my opinion, the patient or the patient's surrogate  understands the proposed procedure, its risks, benefits and alternatives.              Electronically signed by: Campbell Lerner,  MD                             08/16/2022          Date        5:41 PM          Time   Your doctor or someone your doctor has appointed has told you that you may need blood or a blood product transfusion, which has been collected from volunteers, as part of your treatment as a patient.    The reasons you might need blood or blood products include, but are not limited to:    Significant loss of your own blood   Your body may not be getting enough oxygen to its tissues    Treatment of bleeding disorders caused by low platelet counts or platelets that do not work right (platelets are part of a cell that helps to form clots and keeps you from bleeding too much).   You may not have enough of other substances that help your blood clot or stop you from bleeding more  The risks of getting a transfusion of blood or blood products include, but are not limited to:    Damage to the lungs   Difficulty breathing due to fluid in the lungs   The product may contain bacteria or rarely a virus (which includes HIV and Hepatitis)   Blood from the community blood supply has been collected from volunteer donors who have been screened for health risk. The blood has been tested for major blood transmitted disease, but no transfusion is 100% safe. The blood is tested with very sensitive and accurate tests to screen for hepatitis, AIDS, and other disease, which makes the risks very small.   You may have side effects from the transfusion (rash, fever, chills) or an allergic reaction   The transfusion increases your risks of getting infection or cancer coming back   The transfusion can increase the time you have to stay in the hospital   The transfusion can potentially cause death if the wrong blood is given or your body rejects the blood   Before blood is transfused, it is tested again to make sure it is the correct type  There are other options than getting blood or blood products from other people and they include:    Drugs  which can decrease bleeding   Drugs which can cause your body to make more blood (used in elective procedures with advance notice)   Autologous (your own blood) donation (needs pre-arrangement)   No transfusion  If you exercise your right to refuse to be transfused with blood or blood products; these things listed below, among others, could happen to you:   Your body may not get enough oxygen and suffer damage   You may have a higher chance of bleeding   You may limit other options for your condition   You may die from losing too much blood

## 2022-08-16 NOTE — ED Notes (Signed)
Patient discharged with all personal belongings. VSS except BP that was elevated but has a hx of hypertension, IV removed, discharge paperwork reviewed and patient verbalized understanding. Patient has safe transportation out, a safe place to go and access to residence upon arrival. Patient ambulated out with a steady gait on crutches.

## 2022-08-19 ENCOUNTER — Telehealth: Payer: Self-pay | Admitting: Orthopedic Surgery

## 2022-08-19 ENCOUNTER — Other Ambulatory Visit: Payer: Self-pay

## 2022-08-19 ENCOUNTER — Encounter: Payer: Self-pay | Admitting: Orthopedic Surgery

## 2022-08-19 ENCOUNTER — Ambulatory Visit: Payer: Auto Insurance (includes no fault) | Admitting: Orthopedic Surgery

## 2022-08-19 VITALS — BP 136/83 | HR 89 | Ht 70.0 in | Wt 167.0 lb

## 2022-08-19 DIAGNOSIS — S82892A Other fracture of left lower leg, initial encounter for closed fracture: Secondary | ICD-10-CM

## 2022-08-19 NOTE — Telephone Encounter (Signed)
Spoke to patient - all set for today w/ SPS.
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
## 2022-08-22 ENCOUNTER — Other Ambulatory Visit: Payer: Self-pay

## 2022-08-22 NOTE — Invasive Procedure Plan of Care (Signed)
CONSENT FOR MEDICAL  OR SURGICAL PROCEDURE                            Patient Name: Ronnie Morris  [facility] [address]                                                              DOB: 09-14-1971        ? Please read this form or have someone read it to you.  ? It's important to understand all parts of this form. If something isn't clear, ask [place] to explain.  ? When you sign it, that means you understand the form and give [place] permission to do this surgery or procedure.    ? I agree for Basilio Cairo, MD along with any assistants* they may choose, to treat the following condition(s): Ankle fracture, left  ? By doing this surgery or procedure on me: Repair the broken bone in the left akle  ? This is also known as: Left ankle ORIF  ? Laterality: Left     *if you'd like a list of the assistants, please ask. We can give that to you.    1. The care provider has explained my condition to me. They have told me how the procedure can help me. They have told me about other ways of treating my condition. I understand the care provider cannot guarantee the result of the procedure. If I don't have this procedure, my other choices are: No surgery    2. The care provider has told me the risks (problems that can happen) of the procedure. I understand there may be unwanted results. The risks that are related to this procedure include: Bleeding, infection, risk to structures (nerves, blood vessels, tendons), blood clots,  nonunion, persistent pain, stiffness, or need for reoperation.  Risks of anesthesia including stroke, heart attack, and death.     3. I understand that during the procedure, my care provider may find a condition that we didn't know about before the treatment started. Therefore, I agree that my care provider can perform any other treatment which they think is necessary and available.    4. I give permission to the hospital and/or its departments to examine and keep tissue, blood, body parts, fluids or materials  removed from my body during the procedure(s) to aid in diagnosis and treatment, after which they may be used for scientific research or teaching by appropriate persons. If these materials are used for science or teaching, my identity will be protected. I will no longer own or have any rights to these materials regardless of how they may be used.    5. My care provider might want a representative from a medical device company to be there during my procedure. I understand that person works for:  [employer]        The ways they might help my care provider during my procedure include:        Helping the operating room staff prepare the special device my care provider wants to use and Providing information and support to operating room staff regarding the device    6. Here are my decisions about receiving blood, blood products, or tissues. I understand my  decisions cover the time before, during and after my procedure, my treatment, and my time in the hospital. After my procedure, if my condition changes a lot, my care provider will talk with me again about receiving blood or blood products. At that time, my care provider might need me to review and sign another consent form, about getting or refusing blood.    I understand that the blood is from the community blood supply. Volunteers donated the blood, the volunteers were screened for health problems. The blood was examined with very sensitive and accurate tests to look for hepatitis, HIV/AIDS, and other diseases. Before I receive blood, it is tested again to make sure it is the correct type.    My chances of getting a sickness from blood products are small. But no transfusion is 100% safe. I understand that my care provider feels the good I will receive from the blood is greater than the chances of something going wrong. My care provider has answered my questions about blood products.      My decision  about blood or  blood products   Not applicable.        My  decision   about tissue  Implants     Not applicable.          I understand this  form.    My care provider  or his/her  assistants have  explained:   What I am having done and why I need it.  What other choices I can make instead of having this done.  The benefits and possible risks (problems) to me of having this done.  The benefits and possible risks (problems) to me of receiving transplants, blood, or blood products.  There is no guarantee of the results.  The care provider may not stay with me the entire time that I am in the operating or procedure room.  My provider has explained how this may affect my procedure. My provider has answered my  questions about this.         I give my  permission for  this surgery or  procedure.            _______________________________________________                                     My signature  (or parent or other person authorized to sign for you, if you are unable to sign for  yourself or if you are under 45 years old)        ______           Date        _____        Time   Electronic Signatures will display at the bottom of the consent form.    Care provider's statement: I have discussed the planned procedure, including the possibility for transfusion of blood  products or receipt of tissue as necessary; expected benefits; the possible complications and risks; and possible alternatives  and their benefits and risks with the patients or the patient's surrogate. In my opinion, the patient or the patient's surrogate  understands the proposed procedure, its risks, benefits and alternatives.              Electronically signed by: Basilio Cairo, MD  08/22/2022         Date        11:51 PM        Time

## 2022-08-23 ENCOUNTER — Encounter: Payer: Self-pay | Admitting: Student in an Organized Health Care Education/Training Program

## 2022-08-23 ENCOUNTER — Ambulatory Visit
Admission: RE | Admit: 2022-08-23 | Discharge: 2022-08-23 | Disposition: A | Discharge status: Home / Self Care | Payer: Medicaid Other | Source: Ambulatory Visit | Attending: Orthopedic Surgery | Admitting: Orthopedic Surgery

## 2022-08-23 ENCOUNTER — Encounter
Admission: RE | Disposition: A | Discharge status: Home / Self Care | Payer: Self-pay | Source: Ambulatory Visit | Attending: Orthopedic Surgery

## 2022-08-23 ENCOUNTER — Encounter: Payer: Auto Insurance (includes no fault) | Admitting: Student in an Organized Health Care Education/Training Program

## 2022-08-23 ENCOUNTER — Other Ambulatory Visit: Payer: Self-pay

## 2022-08-23 ENCOUNTER — Ambulatory Visit
Admission: RE | Admit: 2022-08-23 | Discharge: 2022-08-23 | Disposition: A | Discharge status: Home / Self Care | Payer: Auto Insurance (includes no fault) | Source: Ambulatory Visit | Attending: Orthopedic Surgery | Admitting: Orthopedic Surgery

## 2022-08-23 DIAGNOSIS — Y998 Other external cause status: Secondary | ICD-10-CM | POA: Insufficient documentation

## 2022-08-23 DIAGNOSIS — Y9389 Activity, other specified: Secondary | ICD-10-CM | POA: Insufficient documentation

## 2022-08-23 DIAGNOSIS — K219 Gastro-esophageal reflux disease without esophagitis: Secondary | ICD-10-CM | POA: Insufficient documentation

## 2022-08-23 DIAGNOSIS — G8918 Other acute postprocedural pain: Secondary | ICD-10-CM

## 2022-08-23 DIAGNOSIS — X58XXXA Exposure to other specified factors, initial encounter: Secondary | ICD-10-CM | POA: Insufficient documentation

## 2022-08-23 DIAGNOSIS — S82892A Other fracture of left lower leg, initial encounter for closed fracture: Secondary | ICD-10-CM

## 2022-08-23 DIAGNOSIS — Y9289 Other specified places as the place of occurrence of the external cause: Secondary | ICD-10-CM | POA: Insufficient documentation

## 2022-08-23 DIAGNOSIS — S8252XA Displaced fracture of medial malleolus of left tibia, initial encounter for closed fracture: Secondary | ICD-10-CM | POA: Insufficient documentation

## 2022-08-23 DIAGNOSIS — F17210 Nicotine dependence, cigarettes, uncomplicated: Secondary | ICD-10-CM | POA: Insufficient documentation

## 2022-08-23 DIAGNOSIS — I10 Essential (primary) hypertension: Secondary | ICD-10-CM | POA: Insufficient documentation

## 2022-08-23 HISTORY — PX: PR OPEN TREATMENT MEDIAL MALLEOLUS FRACTURE: 27766

## 2022-08-23 SURGERY — ORIF, ANKLE
Anesthesia: General | Site: Ankle | Laterality: Left | Wound class: Clean

## 2022-08-23 MED ORDER — HALOPERIDOL LACTATE 5 MG/ML IJ SOLN *I*
1.0000 mg | Freq: Once | INTRAMUSCULAR | Status: DC | PRN
Start: 2022-08-23 — End: 2022-08-23

## 2022-08-23 MED ORDER — CEFAZOLIN 1000 MG IN STERILE WATER 10ML SYRINGE *I*
2000.0000 mg | PREFILLED_SYRINGE | Freq: Once | INTRAVENOUS | Status: AC
Start: 2022-08-23 — End: 2022-08-23
  Administered 2022-08-23: 2000 mg via INTRAVENOUS

## 2022-08-23 MED ORDER — MIDAZOLAM HCL 1 MG/ML IJ SOLN *I* WRAPPED
INTRAMUSCULAR | Status: DC | PRN
Start: 2022-08-23 — End: 2022-08-23
  Administered 2022-08-23 (×3): 2 mg via INTRAVENOUS

## 2022-08-23 MED ORDER — MIDAZOLAM HCL 1 MG/ML IJ SOLN *I* WRAPPED
INTRAMUSCULAR | Status: AC
Start: 2022-08-23 — End: 2022-08-23
  Filled 2022-08-23: qty 2

## 2022-08-23 MED ORDER — ACETAMINOPHEN 325 MG PO TABS *I*
975.0000 mg | ORAL_TABLET | Freq: Once | ORAL | Status: AC
Start: 2022-08-23 — End: 2022-08-23
  Administered 2022-08-23: 975 mg via ORAL

## 2022-08-23 MED ORDER — LACTATED RINGERS IV SOLN *I*
20.0000 mL/h | INTRAVENOUS | Status: DC
Start: 2022-08-23 — End: 2022-08-24
  Administered 2022-08-23: 20 mL/h via INTRAVENOUS

## 2022-08-23 MED ORDER — SODIUM CHLORIDE 0.9 % 25 ML IV SOLN *I*
6.2500 mg | INTRAVENOUS | Status: DC | PRN
Start: 2022-08-23 — End: 2022-08-24

## 2022-08-23 MED ORDER — PROPOFOL 10 MG/ML IV EMUL (INTERMITTENT DOSING) WRAPPED *I*
INTRAVENOUS | Status: DC | PRN
Start: 2022-08-23 — End: 2022-08-23
  Administered 2022-08-23: 100 mg via INTRAVENOUS
  Administered 2022-08-23: 160 mg via INTRAVENOUS
  Administered 2022-08-23: 120 mg via INTRAVENOUS

## 2022-08-23 MED ORDER — HYDROMORPHONE HCL PF 1 MG/ML IJ SOLN *WRAPPED*
INTRAMUSCULAR | Status: DC | PRN
Start: 2022-08-23 — End: 2022-08-23
  Administered 2022-08-23: 1 mg via INTRAVENOUS

## 2022-08-23 MED ORDER — ONDANSETRON HCL 2 MG/ML IV SOLN *I*
INTRAMUSCULAR | Status: DC | PRN
Start: 2022-08-23 — End: 2022-08-23
  Administered 2022-08-23: 4 mg via INTRAVENOUS

## 2022-08-23 MED ORDER — ACETAMINOPHEN 325 MG PO TABS *I*
ORAL_TABLET | ORAL | Status: AC
Start: 2022-08-23 — End: 2022-08-23
  Filled 2022-08-23: qty 3

## 2022-08-23 MED ORDER — ACETAMINOPHEN 160 MG/5 ML WRAPPED *I*
975.0000 mg | Freq: Once | Status: AC
Start: 2022-08-23 — End: 2022-08-23

## 2022-08-23 MED ORDER — DEXAMETHASONE SODIUM PHOSPHATE 4 MG/ML INJ SOLN *WRAPPED*
INTRAMUSCULAR | Status: DC | PRN
Start: 2022-08-23 — End: 2022-08-23
  Administered 2022-08-23: 4 mg via INTRAVENOUS

## 2022-08-23 MED ORDER — DEXMEDETOMIDINE HCL 200 MCG/2ML IV SOLN WRAPPED *I*
INTRAVENOUS | Status: DC | PRN
Start: 2022-08-23 — End: 2022-08-23
  Administered 2022-08-23: 20 ug via INTRAVENOUS

## 2022-08-23 MED ORDER — ASPIRIN 81 MG PO TBEC *I*
81.0000 mg | DELAYED_RELEASE_TABLET | Freq: Two times a day (BID) | ORAL | 0 refills | Status: AC
Start: 2022-08-23 — End: 2022-09-22
  Filled 2022-08-23: qty 60, 30d supply, fill #0

## 2022-08-23 MED ORDER — LIDOCAINE HCL (PF) 1 % IJ SOLN *I*
0.1000 mL | Freq: Once | INTRAMUSCULAR | Status: DC | PRN
Start: 2022-08-23 — End: 2022-08-23

## 2022-08-23 MED ORDER — FENTANYL CITRATE 50 MCG/ML IJ SOLN *WRAPPED*
INTRAMUSCULAR | Status: DC | PRN
Start: 2022-08-23 — End: 2022-08-23
  Administered 2022-08-23: 100 ug via INTRAVENOUS

## 2022-08-23 MED ORDER — BUPIVACAINE-EPINEPHRINE 0.5 % IJ SOLUTION *WRAPPED*
INTRAMUSCULAR | Status: DC | PRN
Start: 2022-08-23 — End: 2022-08-23
  Administered 2022-08-23 (×8): 5 mL via PERINEURAL

## 2022-08-23 MED ORDER — CEFAZOLIN 1000 MG IN STERILE WATER 10ML SYRINGE *I*
PREFILLED_SYRINGE | INTRAVENOUS | Status: AC
Start: 2022-08-23 — End: 2022-08-23
  Filled 2022-08-23: qty 20

## 2022-08-23 MED ORDER — HYDROMORPHONE HCL PF 1 MG/ML IJ SOLN *WRAPPED*
INTRAMUSCULAR | Status: AC
Start: 2022-08-23 — End: 2022-08-23
  Filled 2022-08-23: qty 1

## 2022-08-23 MED ORDER — OXYCODONE HCL 5 MG PO TABS *I*
5.0000 mg | ORAL_TABLET | ORAL | 0 refills | Status: AC | PRN
Start: 2022-08-23 — End: ?
  Filled 2022-08-23: qty 20, 4d supply, fill #0

## 2022-08-23 SURGICAL SUPPLY — 38 items
APPLICATOR DURAPREP 26ML (Solution) ×3 IMPLANT
BANDAGE ELAST 4IN X 5YD STER (Dressing) ×1 IMPLANT
BANDAGE ELAST SLF-CLSR 6X11 LF NONSTER (Dressing) ×1 IMPLANT
BANDAGE ESMARK 6IN X 3YD LF STER (Dressing) ×1 IMPLANT
BLADE SURG CARBON STEEL #15 STER (Supply) ×4 IMPLANT
BLADE SURG CARBON STEEL #15 STER REUSE HNDL (Supply) ×4 IMPLANT
COVER LIGHT HANDLE STERIS ST (Supply) ×2 IMPLANT
CUFF TOURNIQUET SPSB PLC 34IN STER DISP (Supply) ×1 IMPLANT
DRAPE C ARM TRNSPAR POLY PROTCT BARR FOR ~~LOC~~ FLROSCP C-ARMOR (Drape) ×1 IMPLANT
DRAPE C ARM W42XL64IN W/ POLY STRP FOR MOB XR (Drape) ×1 IMPLANT
DRAPE OEC MINI C-ARM (Drape) IMPLANT
DRAPE ORTH W47XL51IN SPL W7XL18IN CLR PLAS U SHP ADH STERI-DRP (Drape) ×1 IMPLANT
DRAPE SURG IOBAN 2 ANTIMIC XL 23 X 33IN (Drape) ×1 IMPLANT
DRAPE SURG U PREP POLY 48 X 52IN (Drape) ×1 IMPLANT
DRAPE U-SHEET SPLIT PLASTIC STERIL (Drape) ×1 IMPLANT
DRESSING XEROFORM STR 1 IN X8 (Dressing) ×1 IMPLANT
DRILL EVOS LONG W/ AO QC 2.0MM (Supply) ×1 IMPLANT
ELECTRODE BLADE MODIFIED 2.5IN (Supply) ×1 IMPLANT
FILTER NEPTUNE 4PORT MANIFOLD (Supply) ×1 IMPLANT
GLOVE BIOGEL PI MICRO IND UNDER SZ 7.5 LF (Glove) ×1 IMPLANT
GLOVE BIOGEL PI MICRO IND UNDER SZ 8.5 LF (Glove) ×4 IMPLANT
GLOVE BIOGEL PI ORTHOPRO SZ 8 LF (Glove) ×2 IMPLANT
GOWN SIRIUS PLY REINF BRTH SLV XL XLONG (Gown) ×1 IMPLANT
K-WIRES DOUBLE TROCAR 0.062 INCH (1.6MM) DIA 4 INCH LONG (1/SET) (Implant) ×1 IMPLANT
PACK CUSTOM MINOR FOOT ANKLE (Pack) ×1 IMPLANT
PAD EXTREMITY PREP 24X44 W/9IN CUFF (Drape) ×1 IMPLANT
PADDING WEBRIL 3IN LF STER (Dressing) ×1 IMPLANT
PADDING WEBRIL 4IN LF NONSTER (Supply) ×2 IMPLANT
SCREW EVOS CTX T8 S-T 2.7MMX80MM (Screw) ×2 IMPLANT
SLEEVE COMP KNEE HI MED (Supply) ×1 IMPLANT
SOL SOD CHL IRRIG 500ML BTL (Solution) ×1 IMPLANT
SPLINT GYPSONA PLASTER FAST 5 X 30IN (Dressing) ×15 IMPLANT
SPONGE LAP XRAY 12IN X 12IN STER (Supply) ×1 IMPLANT
SUTR ETHILON MONO 2-0 30IN BLACK (Suture) ×1 IMPLANT
SUTR VICRYL ANTIB BRD 2-0 CP-2 18 UNDYED (Suture) ×2 IMPLANT
TAPE AC ELAST ADH 3IN X 5YD LF (Supply) ×1 IMPLANT
TRAY PITCHER 1200ML W/HANDLE CSR WRAP (Supply) ×1 IMPLANT
WRAP COBAN SLF ADHER 4IN X 5YD LF STER (Dressing) ×1 IMPLANT

## 2022-08-23 NOTE — Discharge Instructions (Addendum)
Orthopaedic Surgery Discharge Instructions     Date of admission: 08/23/2022  Date of procedure: 08/23/22  Procedure: Left ankle ORIF  Date of discharge: 08/23/2022  Attending Surgeon: Basilio Cairo, MD     Brief Summary:  You presented on 08/23/2022 for left ankle ORIF with Dr. Basilio Cairo, MD.  Prior to discharge, you tolerated a diet, your pain was controlled, and you were cleared for safe discharge.  You are to follow-up with Dr. Basilio Cairo, MD per follow-up instructions    PLEASE STOP SMOKING, IT IS BAD FOR BONE HEALING AND BAD FOR WOUND HEALING     DRESSINGS / WOUND:  [x]   Do not remove dressing until you see your doctor. Keep your dressing clean and dry   [x]   No soaking in tub/pools    Splint/cast care   Keep the splint/cast clean and dry  Do not remove the splint/cast  Do not try to scratch underneath or place anything down the splint/cast  Call the office if the splint/cast is saturated, wet, falling off, or for any other problems  May shower/bathe if splint/cast kept dry otherwise sponge bathe only    WEIGHT-BEARING / ACTIVITY:  [x]   You should NOT put ANY weight through the affected extremity  [x]   Use crutches or walker to assist with walking  [x]   Other activities are as tolerated    DIET:  Resume your usual diet as tolerated.    PAIN CONTROL:  Per medication list below.  Pain an discomfort is normal and expected.  The goal of pain management is to control the discomfort enough to allow you to participate in your recovery.  There is no medication that will render you "pain free."  Opioids/narcotics are powerful medications with side effects that need to be respected.  You should wean off of opioids/narcotics as soon as possible and switch to over-the-counter medication as your pain improves (generally by 1-2 weeks after surgery).  Taking over-the-counter medications such as acetaminophen (Tylenol) and ibuprofen (Advil, Motrin) will help reduce the amount of opioid pain medication you need.  If your  pain medication contains acetaminophen (tylenol), do not take regular tylenol at the same time as your prescription.  As your pain improves you may substitute a dose of regular tylenol for your narcotic pain medication.  Do not take more than 3,000 mg of acetaminophen in a 24 hour period, as this may lead to liver damage.  Acetaminophen and ibuprofen are in different family of medications, so can safely be taken in combination with each other.  You should be taking these over-the-counter medications on a scheduled basis for the first 1-2 weeks after surgery, if permitted by your primary care doctor.  Maximum dose of acetaminophen is 3,000 mg daily (from all sources). For example, you could take 500-1,000 mg every 8 hours around-the-clock.  Maximum dose of ibuprofen in 2,400 mg daily. For example, you could take 400-800 mg every 8 hours around-the-clock.  Our policy is such that all medication refills for narcotic/opioid pain medication but occur during normal business hours (8a-4p) of your provider's office. Night time and weekend calls to our answering service about medication will not result in changes or refills. Please plan ahead and please contact your provider during working hours if you think you may need a refill. We recommend you calling early in the day, especially on Fridays.    MEDICATIONS:  Resume your usual home medication(s) unless otherwise specified.   Narcotics may impair your reflex speed and judgement.  If you are taking prescribed pain medication, you should not drive, operate machinery, power tools, or drink any alcoholic beverages. DO NOT make major decisions, sign contracts, etc.   Narcotic pain medication can also be constipating. Continue to walk, drink plenty of fluids and use an over-the-counter stool softener such as Colace (Docusate Sodium) or Senna, per package instruction, to reduce the risk of constipation.     DVT (BLOOD CLOT) PREVENTION:    Deep Vein Thrombosis     You have been  instructed to take Aspirin twice daily for four weeks to prevent blood clots.     A deep vein thrombosis (DVT) is a blood clot (thrombus) that develops in a deep vein. Deep vein thrombosis can lead to complications if the clot breaks off and travels in the bloodstream to the lungs.      CAUSES  ? Some risk factors include:  l Older age, especially over 48 years old.   l Having a history of blood clots. This means you have had one before. Or, it means that someone else in your family has had blood clots. You may have a genetic tendency to form clots.   l Having major or lengthy surgery. This is especially true for an operation on the hip, knee or belly area (abdomen). Hip surgery is particularly high risk.   l Breaking a hip or leg.  l Sitting or lying still for a long time. This includes long distance travel, paralysis, or recovery from an illness or an operation.  l Having a long, thin tube (catheter) placed inside a vein during a medical procedure.  l Being over weight (obese).  l Medicines with the male hormone estrogen. This includes birth control pills and hormone replacement therapy.   l Smoking.    PREVENTION  ? General preventative advice:  l Exercise the legs regularly.   l Maintain a weight that is appropriate for your height.  l Avoid sitting or lying in bed for long periods of time without moving the legs.  l Women, particularly those over the age of 29, should consider the risks and benefits of taking estrogen medications including birth control pills.  l Do not smoke, especially if you take estrogen medications.  ? Long distance travel can increase the risk of DVT. You should exercise your legs by walking or by pumping the muscles every hour.  ? In-hospital prevention:  l Prevention may include medical and non-medical measures.     SEEK MEDICAL CARE IF:  ? You have unusual bruising or any bleeding problems.  ? The swelling or pain in your affected arm or leg is not gradually improving.  ? You  anticipate long-distance travel or surgery.   ? You discover other family members with blood clots. You (or they) may require further testing for inherited diseases or condition.     SEEK IMMEDIATE MEDICAL CARE IF:  ? You develop chest pain.  ? You develop severe shortness of breath.  ? You begin to cough up bloody mucus or phlegm (sputum).  ? You feel dizzy or faint.  ? You develop swelling or pain in the leg.  ? You have breathing problems after traveling.     SPECIAL INSTRUCTIONS:  You may not drive until instructed to do so by your doctor. Do not drive if you are taking opiate pain medications.    YOU SHOULD CALL YOUR DOCTOR FOR ANY OF THE FOLLOWING:   Fever of 101? or higher  Redness, warmth and  firmness around the incision/wound  Foul smelling drainage from incision or cast/splint/dressing  Pain that does not lessen with pain medication as prescribed by your doctor  Persistent nausea or vomiting into the next day.   Bleeding or continuous oozing that saturates the bandage that does not stop after applying pressure to wound for 5-10 minutes  Increased swelling of fingers, or severe tightness of bandage not relieved with elevation of limb above the level of your heart  Increased numbness or tingling  Pale, blue or cold fingers/toes or nail beds (compared to opposite side)  If you have not urinated within 6 hours after discharge  If you cannot reach Basilio Cairo, MD, then call the Answering Service (on-call phone number:  [phone]).  If you experience a medical emergency then proceed directly to the Emergency Department.    FOLLOW-UP CARE:   Schedule a post-operative appointment  to see Dr. Howell Pringle in two weeks.  If you already have an appointment, then follow-up as previously instructed.  If you do not already have an appointment, or have questions/concerns, please call [phone]    In case of emergency in which you cannot reach your physician, please go directly to the nearest hospital emergency room  department

## 2022-08-23 NOTE — H&P (Signed)
UPDATES TO PATIENT'S CONDITION on the DAY OF SURGERY/PROCEDURE    I. Updates to Patient's Condition (to be completed by a provider privileged to complete a H&P, following reassessment of the patient by the provider):    Day of Surgery/Procedure Update:  History  History reviewed and no change    Physical  Physical exam updated and no change            II. Procedure Readiness   I have reviewed the patient's H&P and updated condition. By completing and signing this form, I attest that this patient is ready for surgery/procedure.    III. Attestation   I have reviewed the updated information regarding the patient's condition and it is appropriate to proceed with the planned surgery/procedure.      Basilio Cairo, MD as of 11:03 AM 08/23/2022

## 2022-08-23 NOTE — Anesthesia Case Conclusion (Signed)
CASE CONCLUSION  Emergence  Actions:  Suctioned, LMA removed and soft bite block  Criteria Used for Airway Removal:  Adequate Tv & RR, acceptable O2 saturation, strong hand grip and 5 sec head lift  Assessment:  Routine  Transport  Directly to: PACU  Airway:  Nasal cannula  Oxygen Delivery:  4 lpm  Position:  Recumbent  Patient Condition on Handoff  Level of Consciousness:  Moderately sedated  Patient Condition:  Stable  Handoff Report to:  RN

## 2022-08-23 NOTE — Anesthesia Procedure Notes (Signed)
---------------------------------------------------------------------------------------------------------------------------------------    AIRWAY   GENERAL INFORMATION AND STAFF    Patient location during procedure: OR       Date of Procedure: 08/23/2022 2:09 PM  CONDITION PRIOR TO MANIPULATION     Current Airway/Neck Condition:  Normal        For more airway physical exam details, see Anesthesia PreOp Evaluation  AIRWAY METHOD     Preoxygenated: yes      Maintained In-Line Stability: yes          To see details of medications used, see MAR    Induction: IV    Mask Difficulty Assessment:  0 - not attempted    Number of Attempts at Approach:  1  FINAL AIRWAY DETAILS    Final Airway Type:  LMA    Final LMA: Unique    LMA Size: 5  ----------------------------------------------------------------------------------------------------------------------------------------

## 2022-08-23 NOTE — Anesthesia Procedure Notes (Addendum)
----------------------------------------------------------------------------------------------------------------------------------------    Sciatic (popliteal) Nerve Block      Date of Procedure: 08/23/2022 11:35 AM    Laterality:  Left    Injection Technique: Single-shot    Reason for Block: Post-op pain management and At Surgeon's request        Patient Location: Pre-op  CONSENT AND TIMEOUT     Consent:  Obtained per policy    Timeout Documented by Nursing  METHOD     Patient Position:  Supine    Monitoring:  Blood pressure, Continuous pulse ox with supplemental oxygen and EKG    Sedation Used:  Yes    Level of Sedation: Minimal              for meds injected see MAR portion of chart      Prep:  Aseptic technique per protocol and Chloraprep      Approach:  In-plane and Lateral    Technique: Ultrasound guided       Attempts (Skin Punctures):  2   NEEDLE     Type:  Short-bevel     Gauge: 22 G     Length: 8 cm  BLOCK EVENTS      No Paresthesia with needle     No Paresthesia with injection     No significant resistance to injection     No significant pain on injection     No blood aspirated     No intravascular injection     Well Tolerated  STAFF     Performed by Freddi Che, MD      Attest: I was present for the entire procedure   Lyndsay Talamante, Elizebeth Brooking, MBBCh  ----------------------------------------------------------------------------------------------------------------------------------------

## 2022-08-23 NOTE — Anesthesia Postprocedure Evaluation (Addendum)
Anesthesia Post-Op Note    Patient: Shrihaan Hanzlik    Procedure(s) Performed:  Procedure Summary  Date:  08/23/2022 Anesthesia Start: 08/23/2022 12:56 PM Anesthesia Stop: 08/23/2022  2:41 PM Room / Location:  [id number] / [facility]   Procedure(s):  Left ORIF, ANKLE 60 minutes Diagnosis:  Left malleolar fracture, closed, initial encounter [S82.892A] Surgeon(s):  Basilio Cairo, MD  Katy Apo, MD Responsible Anesthesia Provider:  Chyrel Masson, MD         Recovery Vitals  BP: 115/84 (08/23/2022  3:15 PM)  Heart Rate: 70 (08/23/2022  3:15 PM)  Heart Rate (via Pulse Ox): 70 (08/23/2022  3:15 PM)  Resp: 17 (08/23/2022  3:15 PM)  Temp: 36 ?C (96.8 ?F) (08/23/2022  3:15 PM)  SpO2: 98 % (08/23/2022  3:15 PM)  O2 Flow Rate: 2 L/min (08/23/2022 12:45 PM)   0-10 Scale: 0 (08/23/2022  3:15 PM)    Anesthesia type:  general  Complications Noted During Procedure or in PACU:  None   Comment:    Patient Location:  PACU  Level of Consciousness:    Recovered to baseline, alert and awake  Patient Participation:     Able to participate  Temperature Status:    Normothermic  Oxygen Saturation:    Within patient's normal range  Cardiac Status:   Within patient's normal range  Fluid Status:    Stable  Airway Patency:     Yes  Pulmonary Status:    Baseline and stable  Pain Management:    Adequate analgesia  Nausea and Vomiting:  None    Post Op Assessment:    Tolerated procedure well      Follow up for Sciatic (popliteal) and Adductor Canal () block Laterality: left  As of  08/26/2022 12:07 PM at [phone],  Per patient  the block is completely resolved.  Injection Site: Negative for Tenderness, Redness, Bruising, Swelling.Pain Score (Since Being In PACU): 2  Time block started to resolve: 08/24/2022 3:00 AM  Time block resolved: 08/24/2022 12:00 PM    Did you take opioid pill(s) before bed? Yes  If yes, were you having pain by then? No    What time did you take the pain pill BECAUSE YOU WERE HAVING PAIN? 08/24/2022 12:00  PM  Highest Pain Score with Pain Medications: 1  What pain medication did you take?  Oxycodone + Tylenol    Patient Response To Block:  Would do again  Follow Up:  Complete      -

## 2022-08-23 NOTE — Anesthesia Procedure Notes (Addendum)
----------------------------------------------------------------------------------------------------------------------------------------    Adductor Canal Nerve Block      Date of Procedure: 08/23/2022 11:45 PM    Laterality:  Left    Injection Technique: Single-shot    Reason for Block: Post-op pain management and At Surgeon's request        Patient Location: Pre-op  CONSENT AND TIMEOUT     Consent:  Obtained per policy    Timeout Documented by Nursing  METHOD     Patient Position:  Supine    Monitoring:  Blood pressure, Continuous pulse ox with supplemental oxygen and EKG    Sedation Used:  Yes    Level of Sedation: Minimal              for meds injected see MAR portion of chart      Sterile Technique:  Hand sanitizing    Prep:  Aseptic technique per protocol and Chloraprep      Approach:  In-plane and Lateral    Technique: Ultrasound guided       Attempts (Skin Punctures):  1   NEEDLE     Type:  Short-bevel     Gauge: 22 G     Length: 5 cm  BLOCK EVENTS      No Paresthesia with needle     No Paresthesia with injection     No significant resistance to injection     No significant pain on injection     No blood aspirated     No intravascular injection     Well Tolerated  STAFF     Performed by Freddi Che, MD      Attest: I was present for the entire procedure   Jahlil Ziller, Elizebeth Brooking, MBBCh  ----------------------------------------------------------------------------------------------------------------------------------------

## 2022-08-26 ENCOUNTER — Encounter: Payer: Self-pay | Admitting: Orthopedic Surgery

## 2022-08-26 NOTE — Addendum Note (Signed)
Addendum  created 08/26/22 1210 by Freddi Che, MD    Clinical Note Signed

## 2022-09-05 ENCOUNTER — Other Ambulatory Visit: Payer: Self-pay

## 2022-09-05 ENCOUNTER — Ambulatory Visit: Payer: Auto Insurance (includes no fault) | Attending: Physician Assistant | Admitting: Physician Assistant

## 2022-09-05 ENCOUNTER — Encounter: Payer: Self-pay | Admitting: Physician Assistant

## 2022-09-05 ENCOUNTER — Ambulatory Visit
Admission: RE | Admit: 2022-09-05 | Discharge: 2022-09-05 | Disposition: A | Discharge status: Home / Self Care | Payer: Auto Insurance (includes no fault) | Source: Ambulatory Visit

## 2022-09-05 VITALS — BP 155/83 | HR 73 | Ht 70.0 in | Wt 167.0 lb

## 2022-09-05 DIAGNOSIS — S82892A Other fracture of left lower leg, initial encounter for closed fracture: Secondary | ICD-10-CM | POA: Insufficient documentation

## 2022-09-05 DIAGNOSIS — S8262XA Displaced fracture of lateral malleolus of left fibula, initial encounter for closed fracture: Secondary | ICD-10-CM

## 2022-09-05 DIAGNOSIS — S62663A Nondisplaced fracture of distal phalanx of left middle finger, initial encounter for closed fracture: Secondary | ICD-10-CM | POA: Insufficient documentation

## 2022-09-05 NOTE — Progress Notes (Signed)
CC: Patient presents for follow up of S/p L Ankle ORIF 08/23/22.    HPI: Ronnie Morris 51 y.o. male  is here for follow up of S/p L Ankle ORIF 08/23/22.  He arrives to the office today wearing a short leg splint over the left lower extremity.  He has been using crutches for ambulation and has been nonweightbearing through left lower extremity.  He has been keeping the leg up and elevated at home.  He has been using occasional over-the-counter pain medications as needed and has been using aspirin for DVT prophylaxis.  Denies loss of sensation/numbness.  Denies fevers, chills, feeling unwell.    Physical Exam:  Vitals:    09/05/22 1151   BP: 155/83   Pulse: 73   Weight: 75.8 kg (167 lb)   Height: 1.778 m (5\' 10" )   Constitutional-Healthy, well appearing, NAD.    Musculoskeletal-skin is dry and intact.  Mild edema about left ankle globally, no significant ecchymosis.  Some resolving ecchymosis and area of swelling over the anteromedial left proximal lower leg with mild discomfort to palpation.  Sensation intact to light touch and palpation of left lower extremity.  Incisions over medial left ankle intact with sutures, no surrounding erythema or active drainage noted.  Able to slightly dorsiflex and plantarflex left ankle with some stiffness noted.  Able to move all toes on left foot, toes are warm and well-perfused.    Imaging:  I personally reviewed the images. xrays of left ankle demonstrates similar appearance of medial malleolus fracture and implants, compared to prior imaging.    Assessment: 51 y.o. male here for follow up of S/p L Ankle ORIF 08/23/22. Patient is doing well.    Plan: Remain nonweightbearing on left lower extremity.  Sutures removed to the office today.  Placed in high tide boot, we discussed removing the boot for gentle range of motion, hygiene, icing as needed.  He should continue to keep the leg up and elevated when resting.  He will reach out to the office with any questions or concerns.   OTC pain meds and ice/elevation as needed. Continue aspirin for DVT prophylaxis. They will call the office with any questions or concerns. Follow-up in the office in 4 weeks with Dr. Howell Pringle with repeat imaging.    This document was dictated with Conservation officer, historic buildings. Please excuse any errors as the document was typeread to the best of my ability.    Genia Hotter, PA-C

## 2022-09-05 NOTE — Procedures (Signed)
Suture removal    Date/Time: 09/05/2022 11:30 AM    Performed by: Dory Larsen, RN  Authorized by: Rosana Fret, PA    Universal protocol:     Procedure explained and questions answered to patient or proxy's satisfaction: yes    Location:     Location:  Lower extremity    Lower extremity location:  Ankle    Ankle location:  L ankle  Procedure details:     Wound appearance:  Clean, pink, nontender and non purulent    Sutures removed?:  yes  Post-procedure details:     Post-removal:  Steri-Strips applied    Patient tolerance of procedure:  Tolerated well, no immediate complications

## 2022-09-09 ENCOUNTER — Encounter: Payer: Self-pay | Admitting: Physician Assistant

## 2022-09-10 ENCOUNTER — Other Ambulatory Visit: Payer: Self-pay

## 2022-09-11 ENCOUNTER — Other Ambulatory Visit: Payer: Self-pay

## 2022-09-11 ENCOUNTER — Encounter: Payer: Self-pay | Admitting: Orthopedic Surgery

## 2022-09-14 NOTE — Op Note (Signed)
Operative Note (Surgical Case/Log ID: [id number])       Date of Surgery: 08/23/2022       Surgeons: Surgeon(s) and Role:     * Cozette Braggs, Darryll Capers, MD - Primary     * Levie Heritage, Ellsworth Lennox, MD - Resident - Assisting   Assistants:         Pre-op Diagnosis: Pre-Op Diagnosis Codes:     * Left malleolar fracture, closed, initial encounter [S82.892A]       Post-op Diagnosis: Post-Op Diagnosis Codes:     * Left malleolar fracture, closed, initial encounter [S82.892A]       Procedure(s) Performed: ORIF left medial malleolus CPT 27766       Anesthesia Type: General        Fluid Totals: No intake/output data recorded.       Estimated Blood Loss: 50 mL       Specimens to Pathology:  * No specimens in log *       Temporary Implants:        Packing:                 Patient Condition: good       Indications: This is a 51 year old male who sustained injury to his left ankle.  He was found to have a left medial malleolar fracture.  Risks and benefits of operative management were discussed and he opted to proceed       Findings (Including unexpected complications): Fracture comminution along the medial cortex     Description of Procedure:   Patient was met in the preanesthesia waiting area and the operative was marked.  Patient was secured to the operative by anesthesia staff.  General anesthesia was induced.  The operative limb was prepped and draped in usual sterile fashion.  [place] compliant timeout was performed.    Incision was made over the medial malleolus.  Skin was sharply incised.  Bovie cautery was used for hemostasis.  Care was taken to protect the saphenous neurovascular bundle.  The fracture was identified and cleaned of hematoma clot and interposed tissue.  It was reduced.  It was held with multiple K wires and clamps.  We then drilled for a [employer] screw.  We drilled bicortically under fluoroscopy.  We then measured the tract of this and placed appropriate length screw.  We then placed a second screw in  a similar fashion.  We then removed the provisional fixation and ensured we had appropriate reduction and appropriate implant placement.  Wounds were irrigated and closed in layers.  The patient was placed in a short leg splint.  He was awoke from general anesthesia in good condition taken to the recovery and without issue.  Conclusion the case with counts were correct.    Postoperatively he will be nonweightbearing for a total of about 6 weeks.  We will see him in the office about 2 weeks for wound check and suture removal.  At that point he can transition to a high tide boot begin range of motion exercises.  He will remain nonweightbearing.  He should be maintained on chemoprophylaxis for DVT.    Signed:  Basilio Cairo, MD  on 09/14/2022 at 12:59 PM

## 2022-09-15 NOTE — Progress Notes (Signed)
Orthopaedics Follow Up Note    Patient: Ronnie Morris  MRN: [id number]  Date of visit: 08/19/2022    Reason for visit:  Follow-up for   1. Left malleolar fracture, closed, initial encounter        Interval history:   He is status post left ankle injury.  He was referred to me for further management.  He has been compliant with nonweightbearing and has been splinted.    Patient's medications, allergies, past medical, surgical, social and family histories were reviewed and updated as appropriate.    Review of systems:  Pertinent positives/negatives: see interval history above  Otherwise negative for 12 system review    Physical Exam:  Vitals:    08/19/22 1206   BP: 136/83   Pulse: 89   Weight: 75.8 kg (167 lb)   Height: 1.778 m (5\' 10" )       General: Alert, no acute distress  Left lower extremity: The skin is mildly swollen.  It is intact.  He is tender to palpation.  He is neurovascular intact distally.    Imaging:  Left ankle radiographs reveal a medial malleolus fracture which is displaced.  I personally reviewed the patients images.    Assessment/Plan: 51 y.o. male,   1. Left malleolar fracture, closed, initial encounter        We discussed operative and nonoperative management of this.  He would like to proceed with surgical management of the displaced medial malleolus fracture which is quite reasonable.  I discussed that we would only reserve nonoperative management for completely nondisplaced fractures or the patients who are unable to undergo surgery.  We will make arrangements for ORIF.  We discussed the risk benefits and recovery.  He is understanding of this.  We will see him back in surgery.    Basilio Cairo, MD  09/15/2022 2:43 PM

## 2022-09-17 ENCOUNTER — Other Ambulatory Visit: Payer: Self-pay

## 2022-09-24 ENCOUNTER — Other Ambulatory Visit: Payer: Self-pay

## 2022-09-30 ENCOUNTER — Other Ambulatory Visit: Payer: Self-pay

## 2022-10-02 ENCOUNTER — Other Ambulatory Visit: Payer: Self-pay

## 2022-10-02 ENCOUNTER — Ambulatory Visit
Admission: RE | Admit: 2022-10-02 | Discharge: 2022-10-02 | Disposition: A | Discharge status: Home / Self Care | Payer: Auto Insurance (includes no fault) | Source: Ambulatory Visit

## 2022-10-02 ENCOUNTER — Encounter: Payer: Self-pay | Admitting: Orthopedic Surgery

## 2022-10-02 ENCOUNTER — Ambulatory Visit: Payer: Auto Insurance (includes no fault) | Attending: Physician Assistant | Admitting: Orthopedic Surgery

## 2022-10-02 VITALS — BP 147/82 | HR 72 | Ht 70.0 in | Wt 167.0 lb

## 2022-10-02 DIAGNOSIS — S82892A Other fracture of left lower leg, initial encounter for closed fracture: Secondary | ICD-10-CM

## 2022-10-02 DIAGNOSIS — M25572 Pain in left ankle and joints of left foot: Secondary | ICD-10-CM

## 2022-10-14 ENCOUNTER — Ambulatory Visit: Payer: Auto Insurance (includes no fault) | Attending: Orthopedic Surgery | Admitting: Physical Therapy

## 2022-10-14 ENCOUNTER — Other Ambulatory Visit: Payer: Self-pay

## 2022-10-14 DIAGNOSIS — S99912D Unspecified injury of left ankle, subsequent encounter: Secondary | ICD-10-CM | POA: Insufficient documentation
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
## 2022-10-21 ENCOUNTER — Ambulatory Visit: Payer: Auto Insurance (includes no fault) | Admitting: Physical Therapy
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
## 2022-10-28 ENCOUNTER — Ambulatory Visit: Payer: Auto Insurance (includes no fault) | Admitting: Physical Therapy

## 2022-10-29 ENCOUNTER — Ambulatory Visit: Payer: Self-pay

## 2022-11-04 ENCOUNTER — Ambulatory Visit: Payer: Auto Insurance (includes no fault) | Admitting: Physical Therapy

## 2022-11-11 ENCOUNTER — Ambulatory Visit: Payer: Auto Insurance (includes no fault) | Admitting: Physical Therapy
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
## 2022-11-18 ENCOUNTER — Ambulatory Visit: Payer: Auto Insurance (includes no fault) | Admitting: Physical Therapy

## 2022-12-02 ENCOUNTER — Other Ambulatory Visit: Payer: Self-pay | Admitting: Orthopedic Surgery

## 2022-12-02 DIAGNOSIS — S82892A Other fracture of left lower leg, initial encounter for closed fracture: Secondary | ICD-10-CM

## 2022-12-03 ENCOUNTER — Other Ambulatory Visit: Payer: Self-pay

## 2022-12-03 ENCOUNTER — Ambulatory Visit: Payer: Self-pay | Admitting: Orthopedic Surgery

## 2022-12-03 ENCOUNTER — Ambulatory Visit: Payer: Auto Insurance (includes no fault) | Attending: Orthopedic Surgery | Admitting: Orthopedic Surgery

## 2022-12-03 ENCOUNTER — Ambulatory Visit
Admission: RE | Admit: 2022-12-03 | Discharge: 2022-12-03 | Disposition: A | Discharge status: Home / Self Care | Payer: Auto Insurance (includes no fault) | Source: Ambulatory Visit

## 2022-12-03 VITALS — BP 167/77 | HR 56 | Ht 70.0 in | Wt 167.0 lb

## 2022-12-03 DIAGNOSIS — S82892A Other fracture of left lower leg, initial encounter for closed fracture: Secondary | ICD-10-CM

## 2022-12-03 DIAGNOSIS — S8252XA Displaced fracture of medial malleolus of left tibia, initial encounter for closed fracture: Secondary | ICD-10-CM

## 2022-12-03 NOTE — Progress Notes (Signed)
PATIENT:   Ronnie Morris, Ronnie Morris MR #:  [id number]   CSN:  [id number] DOB:  [birth year]   DICTATED BY:  Lamar Sprinkles, MD DATE OF VISIT:  12/03/2022     DIAGNOSIS:  Statu post open reduction internal fixation, left ankle fracture by Dr. Howell Pringle.    FOLLOWUP VISIT:  Three months.    X-RAYS UPON RETURN:  Weightbearing, 3 views, left ankle.    HISTORY OF PRESENT ILLNESS:  The patient returns today for routine followup.  He states he has been doing well.  He reports minimal discomfort.  He would like to return back to work.    PHYSICAL EXAMINATION:  Patient in no distress.  Patient's left lower extremity reveals well-healed incisions.  He has good motion at the ankle without instability.  He does have planovalgus alignment.  He is otherwise neurovascularly intact to his left lower extremity.    DIAGNOSTIC IMAGING:  Three views, left ankle were reviewed.  Images show satisfactory alignment and satisfactory placement of the hardware.    ASSESSMENT AND PLAN:  At this point, the patient seems to be doing well.  I will have him return back to work without restrictions as he is doing well.  I will see him back in 3 months for repeat radiographs and followup.             ______________________________  Lamar Sprinkles, MD    JPK/MODL  DD:  12/03/2022 10:46:24  DT:  12/03/2022 11:52:01  Job #:  [id number]    cc:

## 2023-03-04 ENCOUNTER — Other Ambulatory Visit: Payer: Self-pay | Admitting: Orthopedic Surgery

## 2023-03-04 DIAGNOSIS — S82892A Other fracture of left lower leg, initial encounter for closed fracture: Secondary | ICD-10-CM

## 2023-03-05 ENCOUNTER — Ambulatory Visit: Payer: Auto Insurance (includes no fault) | Admitting: Orthopedic Surgery

## 2023-03-05 ENCOUNTER — Ambulatory Visit: Payer: Self-pay | Admitting: Orthopedic Surgery

## 2023-04-15 ENCOUNTER — Telehealth: Payer: Self-pay

## 2023-04-15 NOTE — OR PreOp (Signed)
Pt prescreened, instructed on colonoscopy prep.

## 2023-04-18 ENCOUNTER — Telehealth: Payer: Self-pay | Admitting: Surgery

## 2023-04-18 NOTE — Telephone Encounter (Signed)
LVM for patient to confirm colonoscopy scheduled on 05/02/23 with Dr. Xu

## 2023-04-21 NOTE — Telephone Encounter (Signed)
LVM to call back to confirm colonoscopy on 05/02/2023 with Dr Roda Shutters. Mailed prep to pt as he is not active on mychart recently.

## 2023-05-02 ENCOUNTER — Ambulatory Visit: Payer: Self-pay | Admitting: Surgery

## 2023-09-17 ENCOUNTER — Encounter: Payer: Self-pay | Admitting: Orthopedic Surgery

## 2023-09-19 ENCOUNTER — Telehealth: Payer: Self-pay

## 2023-09-19 NOTE — Telephone Encounter (Addendum)
Patient spoke to [employer]employment/De[employer]. Of Labor regarding his inability to work full time.  He indicates that they informed him to have treating providers office fax a letter to them @ [phone] indicating that he is only able to work part-time.    Patient indicated that letter needs to be received by 09/23/23.

## 2023-09-22 ENCOUNTER — Encounter: Payer: Self-pay | Admitting: Orthopedic Surgery

## 2024-09-16 ENCOUNTER — Other Ambulatory Visit
Admission: RE | Admit: 2024-09-16 | Discharge: 2024-09-16 | Disposition: A | Discharge status: Home / Self Care | Payer: Self-pay | Source: Ambulatory Visit | Attending: Physician Assistant | Admitting: Physician Assistant

## 2024-09-16 DIAGNOSIS — I10 Essential (primary) hypertension: Secondary | ICD-10-CM | POA: Insufficient documentation

## 2024-09-16 DIAGNOSIS — E7800 Pure hypercholesterolemia, unspecified: Secondary | ICD-10-CM | POA: Insufficient documentation

## 2024-09-16 DIAGNOSIS — Z125 Encounter for screening for malignant neoplasm of prostate: Secondary | ICD-10-CM | POA: Insufficient documentation

## 2024-09-16 LAB — COMPREHENSIVE METABOLIC PANEL
ALT: 16 U/L (ref 0–50)
AST: 18 U/L (ref 0–50)
Albumin: 4.5 g/dL (ref 3.5–5.2)
Alk Phos: 87 U/L (ref 40–130)
Calcium: 9 mg/dL (ref 8.6–10.2)
Chloride: 107 mmol/L (ref 96–108)
Creatinine: 1.01 mg/dL (ref 0.67–1.17)
Glucose: 36 mg/dL — CL (ref 60–99)
Lab: 14 mg/dL (ref 6–20)
Sodium: 145 mmol/L (ref 133–145)
Total Protein: 6.6 g/dL (ref 6.3–7.7)
eGFR BY CREAT: 88

## 2024-09-16 LAB — LIPID PANEL
Chol/HDL Ratio: 2.6
Cholesterol: 146 mg/dL
HDL: 57 mg/dL (ref 40–60)
LDL Calculated: 76 mg/dL
Non HDL Cholesterol: 89 mg/dL
Triglycerides: 67 mg/dL

## 2024-09-16 LAB — DATE/TIME NOT PROVIDED

## 2024-09-16 LAB — GLUC COMMENT

## 2024-09-16 LAB — PSA (EFF.4-2010): PSA (eff. 4-2010): 0.51 ng/mL (ref 0.00–4.00)

## 2024-09-17 LAB — RESOLUTION

## 8387-08-31 DEATH — deceased
